# Patient Record
Sex: Female | Born: 1980
Health system: Southern US, Community
[De-identification: ages and names within clinical notes are randomized; demographics above are authoritative.]

## PROBLEM LIST (undated history)

## (undated) DIAGNOSIS — F32A Depression, unspecified: Secondary | ICD-10-CM

## (undated) DIAGNOSIS — E785 Hyperlipidemia, unspecified: Secondary | ICD-10-CM

## (undated) DIAGNOSIS — K219 Gastro-esophageal reflux disease without esophagitis: Secondary | ICD-10-CM

## (undated) DIAGNOSIS — F419 Anxiety disorder, unspecified: Secondary | ICD-10-CM

## (undated) DIAGNOSIS — F329 Major depressive disorder, single episode, unspecified: Secondary | ICD-10-CM

## (undated) DIAGNOSIS — I10 Essential (primary) hypertension: Secondary | ICD-10-CM

## (undated) DIAGNOSIS — E079 Disorder of thyroid, unspecified: Secondary | ICD-10-CM

## (undated) HISTORY — DX: Hyperlipidemia, unspecified: E78.5

## (undated) HISTORY — PX: WISDOM TOOTH EXTRACTION: SHX21

## (undated) HISTORY — PX: TUBAL LIGATION: SHX77

## (undated) HISTORY — DX: Essential (primary) hypertension: I10

## (undated) HISTORY — PX: ABDOMINAL HYSTERECTOMY: SHX81

## (undated) HISTORY — PX: CHOLECYSTECTOMY: SHX55

## (undated) HISTORY — DX: Gastro-esophageal reflux disease without esophagitis: K21.9

## (undated) HISTORY — DX: Disorder of thyroid, unspecified: E07.9

## (undated) HISTORY — PX: INCONTINENCE SURGERY: SHX676

## (undated) HISTORY — PX: TOTAL ABDOMINAL HYSTERECTOMY: SHX209

## (undated) HISTORY — PX: OTHER SURGICAL HISTORY: SHX169

---

## 2013-07-17 DIAGNOSIS — F419 Anxiety disorder, unspecified: Secondary | ICD-10-CM | POA: Insufficient documentation

## 2013-10-22 DIAGNOSIS — E669 Obesity, unspecified: Secondary | ICD-10-CM | POA: Insufficient documentation

## 2013-10-22 DIAGNOSIS — E282 Polycystic ovarian syndrome: Secondary | ICD-10-CM | POA: Insufficient documentation

## 2014-02-26 DIAGNOSIS — Z90710 Acquired absence of both cervix and uterus: Secondary | ICD-10-CM | POA: Insufficient documentation

## 2014-02-26 HISTORY — DX: Acquired absence of both cervix and uterus: Z90.710

## 2014-04-09 DIAGNOSIS — R32 Unspecified urinary incontinence: Secondary | ICD-10-CM | POA: Insufficient documentation

## 2014-10-03 ENCOUNTER — Emergency Department
Admission: EM | Admit: 2014-10-03 | Discharge: 2014-10-03 | Disposition: A | Discharge status: Home / Self Care | Payer: BLUE CROSS/BLUE SHIELD | Source: Home / Self Care | Attending: Emergency Medicine | Admitting: Emergency Medicine

## 2014-10-03 ENCOUNTER — Encounter: Payer: Self-pay | Admitting: Emergency Medicine

## 2014-10-03 DIAGNOSIS — H6593 Unspecified nonsuppurative otitis media, bilateral: Secondary | ICD-10-CM

## 2014-10-03 DIAGNOSIS — J0101 Acute recurrent maxillary sinusitis: Secondary | ICD-10-CM

## 2014-10-03 HISTORY — DX: Depression, unspecified: F32.A

## 2014-10-03 HISTORY — DX: Anxiety disorder, unspecified: F41.9

## 2014-10-03 HISTORY — DX: Major depressive disorder, single episode, unspecified: F32.9

## 2014-10-03 MED ORDER — FLUTICASONE PROPIONATE 50 MCG/ACT NA SUSP: NASAL | Status: DC

## 2014-10-03 MED ORDER — AMOXICILLIN-POT CLAVULANATE 875-125 MG PO TABS: 1.0000 | ORAL_TABLET | Freq: Two times a day (BID) | ORAL | Status: DC

## 2014-10-03 NOTE — ED Notes (Signed)
Reports onset of fever, aches, ear pain with associated sore throat, and cough yesterday. Declines strep test and flu test. Took ibuprofen at 1400 today.

## 2014-10-03 NOTE — ED Provider Notes (Signed)
CSN: [id number]     Arrival date & time 10/03/14  1713 History   First MD Initiated Contact with Patient 10/03/14 1713     Chief Complaint  Patient presents with  . Otalgia  . Sore Throat   (Consider location/radiation/quality/duration/timing/severity/associated sxs/prior Treatment) HPI SINUSITIS  Onset: 3-4 days Facial/sinus pressure with discolored nasal mucus.    Severity: moderate Tried OTC meds without significant relief.  Symptoms:  + Fever  + URI prodrome with nasal congestion + Minimal swollen neck glands + mild Sinus Headache +  ear pressure and pain bilaterally  No Allergy symptoms No significant Sore Throat No eye symptoms     No significant Cough No chest pain No shortness of breath  No wheezing  No Abdominal Pain No Nausea No Vomiting No diarrhea  No Myalgias No focal neurologic symptoms No syncope No Rash  No Urinary symptoms          Past Medical History  Diagnosis Date  . Anxiety and depression    Past Surgical History  Procedure Laterality Date  . Abdominal hysterectomy    . Cholecystectomy     Family History  Problem Relation Age of Onset  . Hypertension Mother   . Hypertension Brother    History  Substance Use Topics  . Smoking status: Never Smoker   . Smokeless tobacco: Not on file  . Alcohol Use: No   OB History    No data available     Review of Systems  All other systems reviewed and are negative.   Allergies  Review of patient's allergies indicates no known allergies.  Home Medications   Prior to Admission medications   Medication Sig Start Date End Date Taking? Authorizing Provider  FLUoxetine (PROZAC) 40 MG capsule Take 40 mg by mouth daily.   Yes Historical Provider, MD  amoxicillin-clavulanate (AUGMENTIN) 875-125 MG per tablet Take 1 tablet by mouth 2 (two) times daily. Take with food. 10/03/14   Lajean Manesavid Massey, MD  fluticasone Aleda Grana(FLONASE) 50 MCG/ACT nasal spray 1 or 2 sprays each nostril twice a day  10/03/14   Lajean Manesavid Massey, MD   BP 157/89 mmHg  Pulse 83  Temp(Src) 98.5 F (36.9 C)  Resp 6  Ht 5\' 4"  (1.626 m)  Wt 175 lb (79.379 kg)  BMI 30.02 kg/m2  SpO2 98% Physical Exam  Constitutional: She is oriented to person, place, and time. She appears well-developed and well-nourished. No distress.  HENT:  Head: Normocephalic and atraumatic.  Right Ear: External ear and ear canal normal. Tympanic membrane is injected. Tympanic membrane is not perforated, not erythematous and not bulging. A middle ear effusion is present. No decreased hearing is noted.  Left Ear: External ear and ear canal normal. Tympanic membrane is injected. Tympanic membrane is not perforated, not erythematous and not bulging. A middle ear effusion is present. No decreased hearing is noted.  Nose: Mucosal edema and rhinorrhea present. Right sinus exhibits maxillary sinus tenderness. Left sinus exhibits maxillary sinus tenderness.  Mouth/Throat: Oropharynx is clear and moist. No oral lesions. No oropharyngeal exudate.  Eyes: Right eye exhibits no discharge. Left eye exhibits no discharge. No scleral icterus.  Neck: Neck supple.  Cardiovascular: Normal rate, regular rhythm and normal heart sounds.   Pulmonary/Chest: Effort normal and breath sounds normal. She has no wheezes. She has no rales.  Lymphadenopathy:    She has no cervical adenopathy.  Neurological: She is alert and oriented to person, place, and time.  Skin: Skin is warm and dry.  Nursing  note and vitals reviewed.   ED Course  Procedures (including critical care time) Labs Review Labs Reviewed - No data to display  Imaging Review No results found.   MDM   1. Acute recurrent maxillary sinusitis   2. Bilateral otitis media with effusion    Treatment options discussed, as well as risks, benefits, alternatives. Patient voiced understanding and agreement with the following plans: Augmentin 875 twice a day 10 days Flonase Other symptomatic care  discussed Follow-up with your primary care doctor in 5-7 days if not improving, or sooner if symptoms become worse. Precautions discussed. Red flags discussed. Questions invited and answered. Patient voiced understanding and agreement.     Lajean Manes, MD 10/03/14 [phone]

## 2016-09-07 DIAGNOSIS — F32A Depression, unspecified: Secondary | ICD-10-CM | POA: Insufficient documentation

## 2017-03-29 DIAGNOSIS — E785 Hyperlipidemia, unspecified: Secondary | ICD-10-CM | POA: Insufficient documentation

## 2017-03-29 DIAGNOSIS — E7800 Pure hypercholesterolemia, unspecified: Secondary | ICD-10-CM | POA: Insufficient documentation

## 2017-04-01 DIAGNOSIS — R7301 Impaired fasting glucose: Secondary | ICD-10-CM | POA: Insufficient documentation

## 2020-05-06 ENCOUNTER — Other Ambulatory Visit: Payer: Self-pay | Admitting: Oncology

## 2020-05-06 DIAGNOSIS — U071 COVID-19: Secondary | ICD-10-CM

## 2020-05-06 NOTE — Progress Notes (Signed)
I connected by phone with Mrs. Krogh to discuss the potential use of an new treatment for mild to moderate COVID-19 viral infection in non-hospitalized patients.   This patient is a age/sex that meets the FDA criteria for Emergency Use Authorization of casirivimab\imdevimab.  Has a (+) direct SARS-CoV-2 viral test result 1. Has mild or moderate COVID-19  2. Is ? 41 years of age and weighs ? 40 kg 3. Is NOT hospitalized due to COVID-19 4. Is NOT requiring oxygen therapy or requiring an increase in baseline oxygen flow rate due to COVID-19 5. Is within 10 days of symptom onset 6. Has at least one of the high risk factor(s) for progression to severe COVID-19 and/or hospitalization as defined in EUA. Specific high risk criteria : Past Medical History:  Diagnosis Date  . Anxiety and depression   ?  HIGH RISK- Obesity   Symptom onset 05/01/2020   I have spoken and communicated the following to the patient or parent/caregiver:   1. FDA has authorized the emergency use of casirivimab\imdevimab for the treatment of mild to moderate COVID-19 in adults and pediatric patients with positive results of direct SARS-CoV-2 viral testing who are 73 years of age and older weighing at least 40 kg, and who are at high risk for progressing to severe COVID-19 and/or hospitalization.   2. The significant known and potential risks and benefits of casirivimab\imdevimab, and the extent to which such potential risks and benefits are unknown.   3. Information on available alternative treatments and the risks and benefits of those alternatives, including clinical trials.   4. Patients treated with casirivimab\imdevimab should continue to self-isolate and use infection control measures (e.g., wear mask, isolate, social distance, avoid sharing personal items, clean and disinfect "high touch" surfaces, and frequent handwashing) according to CDC guidelines.    5. The patient or parent/caregiver has the option to accept  or refuse casirivimab\imdevimab .   After reviewing this information with the patient, The patient agreed to proceed with receiving casirivimab\imdevimab infusion and will be provided a copy of the Fact sheet prior to receiving the infusion.Mignon Pine, AGNP-C [phone] (Infusion Center Hotline)

## 2020-05-07 ENCOUNTER — Ambulatory Visit (HOSPITAL_COMMUNITY)
Admission: RE | Admit: 2020-05-07 | Discharge: 2020-05-07 | Disposition: A | Discharge status: Home / Self Care | Payer: No Typology Code available for payment source | Source: Ambulatory Visit | Attending: Pulmonary Disease | Admitting: Pulmonary Disease

## 2020-05-07 DIAGNOSIS — U071 COVID-19: Secondary | ICD-10-CM | POA: Diagnosis not present

## 2020-05-07 MED ORDER — DIPHENHYDRAMINE HCL 50 MG/ML IJ SOLN: 50.0000 mg | Freq: Once | INTRAMUSCULAR | Status: DC | PRN

## 2020-05-07 MED ORDER — FAMOTIDINE IN NACL 20-0.9 MG/50ML-% IV SOLN: 20.0000 mg | Freq: Once | INTRAVENOUS | Status: DC | PRN

## 2020-05-07 MED ORDER — SODIUM CHLORIDE 0.9 % IV SOLN: INTRAVENOUS | Status: DC | PRN

## 2020-05-07 MED ORDER — ALBUTEROL SULFATE HFA 108 (90 BASE) MCG/ACT IN AERS: 2.0000 | INHALATION_SPRAY | Freq: Once | RESPIRATORY_TRACT | Status: DC | PRN

## 2020-05-07 MED ORDER — METHYLPREDNISOLONE SODIUM SUCC 125 MG IJ SOLR: 125.0000 mg | Freq: Once | INTRAMUSCULAR | Status: DC | PRN

## 2020-05-07 MED ORDER — SODIUM CHLORIDE 0.9 % IV SOLN
1200.0000 mg | Freq: Once | INTRAVENOUS | Status: AC
  Administered 2020-05-07: 1200 mg via INTRAVENOUS

## 2020-05-07 MED ORDER — ONDANSETRON HCL 4 MG/2ML IJ SOLN
4.0000 mg | Freq: Once | INTRAMUSCULAR | Status: AC
  Administered 2020-05-07: 4 mg via INTRAVENOUS
  Filled 2020-05-07: qty 2

## 2020-05-07 MED ORDER — EPINEPHRINE 0.3 MG/0.3ML IJ SOAJ: 0.3000 mg | Freq: Once | INTRAMUSCULAR | Status: DC | PRN

## 2020-05-07 NOTE — Discharge Instructions (Signed)

## 2020-05-07 NOTE — Progress Notes (Signed)
  Diagnosis: COVID-19  Physician: Dr. Wright  Procedure: [facility] Infusion Clinic Med: casirivimab\imdevimab infusion - Provided patient with casirivimab\imdevimab fact sheet for patients, parents and caregivers prior to infusion.  Complications: No immediate complications noted.  Discharge: Discharged home   Anita Heath 05/07/2020   

## 2020-05-09 ENCOUNTER — Ambulatory Visit: Payer: BLUE CROSS/BLUE SHIELD | Admitting: Medical-Surgical

## 2020-05-11 ENCOUNTER — Ambulatory Visit: Payer: BLUE CROSS/BLUE SHIELD | Admitting: Medical-Surgical

## 2020-05-12 ENCOUNTER — Encounter: Payer: Self-pay | Admitting: Medical-Surgical

## 2020-05-12 ENCOUNTER — Ambulatory Visit (INDEPENDENT_AMBULATORY_CARE_PROVIDER_SITE_OTHER): Payer: No Typology Code available for payment source | Admitting: Medical-Surgical

## 2020-05-12 ENCOUNTER — Other Ambulatory Visit: Payer: Self-pay

## 2020-05-12 VITALS — BP 128/83 | HR 82 | Temp 98.5°F | Ht 64.0 in | Wt 222.8 lb

## 2020-05-12 DIAGNOSIS — Z7689 Persons encountering health services in other specified circumstances: Secondary | ICD-10-CM

## 2020-05-12 DIAGNOSIS — Z114 Encounter for screening for human immunodeficiency virus [HIV]: Secondary | ICD-10-CM

## 2020-05-12 DIAGNOSIS — E041 Nontoxic single thyroid nodule: Secondary | ICD-10-CM | POA: Diagnosis not present

## 2020-05-12 DIAGNOSIS — E559 Vitamin D deficiency, unspecified: Secondary | ICD-10-CM

## 2020-05-12 DIAGNOSIS — E785 Hyperlipidemia, unspecified: Secondary | ICD-10-CM

## 2020-05-12 DIAGNOSIS — I10 Essential (primary) hypertension: Secondary | ICD-10-CM

## 2020-05-12 DIAGNOSIS — Z6838 Body mass index (BMI) 38.0-38.9, adult: Secondary | ICD-10-CM

## 2020-05-12 DIAGNOSIS — R0683 Snoring: Secondary | ICD-10-CM

## 2020-05-12 DIAGNOSIS — E661 Drug-induced obesity: Secondary | ICD-10-CM

## 2020-05-12 DIAGNOSIS — Z1159 Encounter for screening for other viral diseases: Secondary | ICD-10-CM

## 2020-05-12 MED ORDER — FLUTICASONE PROPIONATE 50 MCG/ACT NA SUSP: NASAL | 0 refills | Status: DC

## 2020-05-12 MED ORDER — PHENTERMINE HCL 37.5 MG PO TABS: ORAL_TABLET | ORAL | 0 refills | Status: DC

## 2020-05-12 MED FILL — PHENTERMINE 37.5 MG TABLET: 37.5 | 60 days supply | Qty: 30 | Fill #0

## 2020-05-12 NOTE — Progress Notes (Signed)
New Patient Office Visit  Subjective:  Patient ID: Anita Heath, female    DOB: 06-11-1981  Age: 39 y.o. MRN: [id number]  CC:  Chief Complaint  Patient presents with  . Establish Care  . Weight Gain  . Hypothyroidism    HPI Anita Heath presents to establish care.  HTN- Taking Lisinopril 20mg  daily. Tolerating well, no side effects.  Check blood pressures at home with normal readings of 110-115/60-75.  Denies CP, SOB, palpitations, lower extremity edema, dizziness, headaches, or vision changes.  Depression/Anxiety-taking prozac 40mg  daily for years.  Feels that her symptoms are overall well controlled.  Does admit that she has been feeling a little more down about herself lately but feels this is related to weight gain and poor body image.  Is interested in weight loss options today.  Thyroid-history of hyperthyroidism with nodule present.  Taking methimazole 5mg  daily for one year.  Last TSH checked in December 2020.  Overdue due for thyroid , done yearly to evaluate nodule growth.  Was previously managed by endocrinology with [facility] who like to have this managed here if possible.  Sleeping problems-endorses significant difficulty with sleep onset.  Sometimes it takes hours for her to be able to fall asleep.  When she is asleep she is able to stay there without frequent nighttime wakings.  Does endorse loud snoring and daytime fatigue.  Notes her husband has seen her stop breathing in her sleep before.  Has never had a sleep study but is open to having one completed if it can help her. STOP-BANG for SLEEP APNEA Do you Snore loudly? Yes Do you often feel Tired during day? Yes Has anyone Observed you stop breathing? Yes History of high blood Pressure? Yes BMI >35? Yes Age >50? No Neck circumference >16 in? Yes Gender female? No 5-8 = high risk 3-4 = intermediate 0-2 = low risk   Hyperlipidemia-taking rosuvastatin 5 mg daily, tolerating well without side effects.  Last  cholesterol levels checked in December.  Weight concerns-notes that she is heavier now than she was at 9 months pregnant and is very concerned about her weight.  Notes this has a negative effect on not only her body image but her blood pressure as well.  Reports that she did take phentermine approximately 6 years ago with good results and is interested in restarting on that or trying a different medication.  Has made quite a few lifestyle changes in the past few months including cutting out sodas, potatoes, white bread/pasta.  She is also been exercising 4-5 days/week doing cardio as well as weight training.  Trying to eat more fresh foods focusing on lean meats, veggies, and fruits.  With her lifestyle changes, she has been able to lose 3 pounds but feels that she is going to need help to lose more.  Past Medical History:  Diagnosis Date  . Anxiety and depression   . Hyperlipidemia   . Hypertension   . Thyroid disease     Past Surgical History:  Procedure Laterality Date  . ABDOMINAL HYSTERECTOMY    . CHOLECYSTECTOMY    . INCONTINENCE SURGERY    . TUBAL LIGATION    . Uterine Ablation    . WISDOM TOOTH EXTRACTION      Family History  Problem Relation Age of Onset  . Hypertension Mother   . Diabetes Mother   . Thyroid disease Mother   . Hypertension Brother   . Thyroid disease Brother   . Hypertension Maternal Aunt   .  Diabetes Maternal Aunt   . Skin cancer Maternal Aunt   . Thyroid disease Maternal Aunt   . Hypertension Maternal Grandmother   . Thyroid disease Maternal Grandmother   . Hypertension Maternal Grandfather   . Skin cancer Maternal Grandfather   . Thyroid disease Maternal Grandfather     Social History   Socioeconomic History  . Marital status: Married    Spouse name: Not on file  . Number of children: Not on file  . Years of education: Not on file  . Highest education level: Not on file  Occupational History  . Not on file  Tobacco Use  . Smoking status:  Never Smoker  . Smokeless tobacco: Never Used  Vaping Use  . Vaping Use: Never used  Substance and Sexual Activity  . Alcohol use: No  . Drug use: Never  . Sexual activity: Yes    Birth control/protection: Surgical  Other Topics Concern  . Not on file  Social History Narrative  . Not on file   Social Determinants of Health   Financial Resource Strain:   . Difficulty of Paying Living Expenses: Not on file  Food Insecurity:   . Worried About Programme researcher, broadcasting/film/video in the Last Year: Not on file  . Ran Out of Food in the Last Year: Not on file  Transportation Needs:   . Lack of Transportation (Medical): Not on file  . Lack of Transportation (Non-Medical): Not on file  Physical Activity:   . Days of Exercise per Week: Not on file  . Minutes of Exercise per Session: Not on file  Stress:   . Feeling of Stress : Not on file  Social Connections:   . Frequency of Communication with Friends and Family: Not on file  . Frequency of Social Gatherings with Friends and Family: Not on file  . Attends Religious Services: Not on file  . Active Member of Clubs or Organizations: Not on file  . Attends Banker Meetings: Not on file  . Marital Status: Not on file  Intimate Partner Violence:   . Fear of Current or Ex-Partner: Not on file  . Emotionally Abused: Not on file  . Physically Abused: Not on file  . Sexually Abused: Not on file    ROS Review of Systems  Constitutional: Positive for fatigue. Negative for chills, fever and unexpected weight change.  Respiratory: Negative for cough, chest tightness, shortness of breath and wheezing.   Cardiovascular: Negative for chest pain, palpitations and leg swelling.  Gastrointestinal: Negative for abdominal pain, constipation, diarrhea, nausea and vomiting.  Genitourinary: Negative for dysuria, frequency and urgency.  Neurological: Negative for dizziness, light-headedness and headaches.  Psychiatric/Behavioral: Positive for dysphoric  mood and sleep disturbance. Negative for self-injury and suicidal ideas. The patient is not nervous/anxious.     Objective:   Today's Vitals: BP 128/83   Pulse 82   Temp 98.5 F (36.9 C) (Oral)   Ht 5\' 4"  (1.626 m)   Wt 222 lb 12.8 oz (101.1 kg)   SpO2 97%   BMI 38.24 kg/m   Physical Exam Vitals reviewed.  Constitutional:      General: She is not in acute distress.    Appearance: Normal appearance.  HENT:     Head: Normocephalic and atraumatic.  Cardiovascular:     Rate and Rhythm: Normal rate and regular rhythm.     Pulses: Normal pulses.     Heart sounds: Normal heart sounds. No murmur heard.  No friction rub. No  gallop.   Pulmonary:     Effort: Pulmonary effort is normal. No respiratory distress.     Breath sounds: Normal breath sounds. No wheezing.  Skin:    General: Skin is warm and dry.  Neurological:     Mental Status: She is alert and oriented to person, place, and time.  Psychiatric:        Mood and Affect: Mood normal.        Behavior: Behavior normal.        Thought Content: Thought content normal.        Judgment: Judgment normal.     Assessment & Plan:   1. Encounter to establish care Reviewed available records and discussed care concerns with patient.  2. Encounter for screening for HIV Discussed screening recommendations.  Patient is agreeable so we will add this to blood work today. - HIV Antibody (routine testing w rflx)  3. Encounter for hepatitis C screening test for low risk patient Discussed screening recommendations.  She is agreeable to this as well so we are adding this to blood work today. - Hepatitis C antibody  4. Thyroid nodule She is overdue for her thyroid ultrasound, I will go ahead and order that to be completed.  No check in thyroid since December while taking daily methimazole.  Her trend did show an upward tic of her TSH (0.604, 1.745, 3.372). Some of her weight gain and fatigue could be related to thyroid function as this has  not been checked recently. Checking thyroid panel with TSH to evaluate for need to adjust methimazole.  - US THYROID; Future - Thyroid Panel With TSH  5. Hyperlipidemia, unspecified hyperlipidemia type Checking lipid panel today.  Continue Crestor as prescribed. - Lipid panel  6. Hypertension, unspecified type Checking CBC, CMP, and lipid panel today. Continue lisinopril 20 mg daily. - CBC - COMPLETE METABOLIC PANEL WITH GFR  7. Loud snoring Discussed sleep apnea and its effect on the body.  She is agreeable so we are ordering a sleep study today.   - PSG Sleep Study; Future  8. 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  Restarting phentermine one half tab daily for now.  Advised patient to maintain a food diary, continue exercising regularly, and try to increase sleep quality/stress.  9. Vitamin D deficiency History of vitamin D deficiency on oral supplementation daily.  Rechecking vitamin D today as last level was still slightly low despite replacement. - VITAMIN D 25 Hydroxy (Vit-D Deficiency, Fractures)   Outpatient Encounter Medications as of 05/12/2020  Medication Sig  . Ascorbic Acid (VITAMIN C) 1000 MG tablet Take 1,000 mg by mouth daily.  . Cholecalciferol 50 MCG (2000 UT) CAPS Take 1 capsule by mouth daily.  Marland Kitchen FLUoxetine (PROZAC) 40 MG capsule Take 40 mg by mouth daily.  . fluticasone (FLONASE) 50 MCG/ACT nasal spray 1 or 2 sprays each nostril twice a day  . lisinopril (ZESTRIL) 20 MG tablet Take 20 mg by mouth daily.  . Magnesium 250 MG TABS Take 1 tablet by mouth daily.  . methimazole (TAPAZOLE) 5 MG tablet Take 5 mg by mouth daily.  . rosuvastatin (CRESTOR) 5 MG tablet Take 5 mg by mouth daily.  . [DISCONTINUED] amoxicillin-clavulanate (AUGMENTIN) 875-125 MG per tablet Take 1 tablet by mouth 2 (two) times daily. Take  with food.   No facility-administered encounter medications on file  as of 05/12/2020.    Follow-up: No follow-ups on file.   Thayer OhmJoy L. Harlean Regula, DNP, APRN, FNP-BC [facility]

## 2020-05-13 ENCOUNTER — Other Ambulatory Visit: Payer: Self-pay

## 2020-05-13 ENCOUNTER — Encounter: Payer: Self-pay | Admitting: Medical-Surgical

## 2020-05-13 ENCOUNTER — Ambulatory Visit (INDEPENDENT_AMBULATORY_CARE_PROVIDER_SITE_OTHER): Payer: No Typology Code available for payment source

## 2020-05-13 DIAGNOSIS — E041 Nontoxic single thyroid nodule: Secondary | ICD-10-CM

## 2020-05-13 MED ORDER — METHIMAZOLE 5 MG PO TABS: 5.0000 mg | ORAL_TABLET | Freq: Every day | ORAL | 1 refills | Status: DC

## 2020-05-13 MED FILL — methIMAzole 5 MG TABS: 5 | 90 days supply | Qty: 90 | Fill #0

## 2020-05-17 LAB — LIPID PANEL
Cholesterol: 137 mg/dL (ref <200)
HDL: 31 mg/dL — ABNORMAL LOW (ref >=50)
LDL Cholesterol (Calc): 72 mg/dL (calc)
Non-HDL Cholesterol (Calc): 106 mg/dL (calc) (ref <130)
Total CHOL/HDL Ratio: 4.4 (calc) (ref <5.0)
Triglycerides: 245 mg/dL — ABNORMAL HIGH (ref <150)

## 2020-05-17 LAB — CBC
HCT: 42.4 % (ref 35.0–45.0)
Hemoglobin: 14.1 g/dL (ref 11.7–15.5)
MCH: 28.3 pg (ref 27.0–33.0)
MCHC: 33.3 g/dL (ref 32.0–36.0)
MCV: 85.1 fL (ref 80.0–100.0)
MPV: 9.7 fL (ref 7.5–12.5)
Platelets: 321 10*3/uL (ref 140–400)
RBC: 4.98 10*6/uL (ref 3.80–5.10)
RDW: 12.5 % (ref 11.0–15.0)
WBC: 7.5 10*3/uL (ref 3.8–10.8)

## 2020-05-17 LAB — HEPATITIS C ANTIBODY
Hepatitis C Ab: REACTIVE — AB
SIGNAL TO CUT-OFF: 2.22 — ABNORMAL HIGH (ref <1.00)

## 2020-05-17 LAB — COMPLETE METABOLIC PANEL WITH GFR
AG Ratio: 1.5 (calc) (ref 1.0–2.5)
ALT: 29 U/L (ref 6–29)
AST: 24 U/L (ref 10–30)
Albumin: 4.4 g/dL (ref 3.6–5.1)
Alkaline phosphatase (APISO): 79 U/L (ref 31–125)
BUN: 14 mg/dL (ref 7–25)
CO2: 30 mmol/L (ref 20–32)
Calcium: 9.3 mg/dL (ref 8.6–10.2)
Chloride: 101 mmol/L (ref 98–110)
Creat: 0.71 mg/dL (ref 0.50–1.10)
GFR, Est African American: 123 mL/min/{1.73_m2} (ref >=60)
GFR, Est Non African American: 106 mL/min/{1.73_m2} (ref >=60)
Globulin: 2.9 g/dL (calc) (ref 1.9–3.7)
Glucose, Bld: 96 mg/dL (ref 65–99)
Potassium: 4.8 mmol/L (ref 3.5–5.3)
Sodium: 138 mmol/L (ref 135–146)
Total Bilirubin: 0.4 mg/dL (ref 0.2–1.2)
Total Protein: 7.3 g/dL (ref 6.1–8.1)

## 2020-05-17 LAB — HIV ANTIBODY (ROUTINE TESTING W REFLEX): HIV 1&2 Ab, 4th Generation: NONREACTIVE

## 2020-05-17 LAB — THYROID PANEL WITH TSH
Free Thyroxine Index: 2.3 (ref 1.4–3.8)
T3 Uptake: 29 % (ref 22–35)
T4, Total: 8 ug/dL (ref 5.1–11.9)
TSH: 2.82 mIU/L

## 2020-05-17 LAB — VITAMIN D 25 HYDROXY (VIT D DEFICIENCY, FRACTURES): Vit D, 25-Hydroxy: 30 ng/mL (ref 30–100)

## 2020-05-17 LAB — HCV RNA,QUANTITATIVE REAL TIME PCR
HCV Quantitative Log: <1.18 Log IU/mL
HCV RNA, PCR, QN: <15 IU/mL

## 2020-05-19 ENCOUNTER — Other Ambulatory Visit: Payer: Self-pay

## 2020-05-19 ENCOUNTER — Encounter: Payer: Self-pay | Admitting: Medical-Surgical

## 2020-05-19 ENCOUNTER — Ambulatory Visit (INDEPENDENT_AMBULATORY_CARE_PROVIDER_SITE_OTHER): Payer: No Typology Code available for payment source

## 2020-05-19 ENCOUNTER — Telehealth (INDEPENDENT_AMBULATORY_CARE_PROVIDER_SITE_OTHER): Payer: No Typology Code available for payment source | Admitting: Medical-Surgical

## 2020-05-19 DIAGNOSIS — R509 Fever, unspecified: Secondary | ICD-10-CM

## 2020-05-19 DIAGNOSIS — R05 Cough: Secondary | ICD-10-CM

## 2020-05-19 DIAGNOSIS — R059 Cough, unspecified: Secondary | ICD-10-CM

## 2020-05-19 LAB — POCT INFLUENZA A/B
Influenza A, POC: NEGATIVE
Influenza B, POC: NEGATIVE

## 2020-05-19 NOTE — Progress Notes (Signed)
Virtual Visit via Video Note  I connected with Anita Heath on 05/19/20 at  2:40 PM EDT by a video enabled telemedicine application and verified that I am speaking with the correct person using two identifiers.   I discussed the limitations of evaluation and management by telemedicine and the availability of in person appointments. The patient expressed understanding and agreed to proceed.  Patient location: home Provider locations: office  Subjective:    CC: Fever/cough  HPI: Pleasant 39 year old female presenting via MyChart video visit with reports of fever and cough.  She was diagnosed as Covid positive on 9/15 and received the antibiotic infusion after.  Notes that after her infusion she did start to feel better and most of her symptoms fully resolved except for a mild intermittent dry cough and a mild headache.  This morning she awoke to go to work and discovered she had a fever of 101.6 and her signs and symptoms had returned including dyspnea on exertion, body aches, chills, fatigue, and sore throat.  Notes that her cough has also worsened but is still nonproductive.  Has not regained her sense of taste and smell.  She did call health at work who advised her to call her PCP.  They told her since she tested positive for Covid already that she will continue to test positive positive for the next 3 months.  She has been taking Tylenol which does help with her fevers but they return as soon as it wears off.  Denies chest pain and GI symptoms.  States she feels as if she cannot take a full deep breath.  Past medical history, Surgical history, Family history not pertinant except as noted below, Social history, Allergies, and medications have been entered into the medical record, reviewed, and corrections made.   Review of Systems: See HPI for pertinent positives and negatives.   Objective:    General: Speaking clearly in complete sentences without any shortness of breath.  Alert and oriented  x3.  Normal judgment. No apparent acute distress.  Impression and Recommendations:    1. Fever/cough CBC checked last week was normal.  Getting a stat chest x-ray to evaluate for potential pneumonia.  She will do a drive-by flu swab.  Continue Tylenol as needed.  Okay to take over-the-counter cold medications for symptomatic care.  Will need to be out of work for the next 7 days.  Letter provided via MyChart. - DG Chest 2 View; Future - POCT Influenza A/B  Addendum: Rapid flu swab negative.   I discussed the assessment and treatment plan with the patient. The patient was provided an opportunity to ask questions and all were answered. The patient agreed with the plan and demonstrated an understanding of the instructions.   The patient was advised to call back or seek an in-person evaluation if the symptoms worsen or if the condition fails to improve as anticipated.  20 minutes of non face-to-face time was provided during this encounter.  Return if symptoms worsen or fail to improve.  Thayer Ohm, DNP, APRN, FNP-BC [facility] and [facility]

## 2020-05-20 ENCOUNTER — Encounter: Payer: Self-pay | Admitting: Medical-Surgical

## 2020-05-23 ENCOUNTER — Encounter: Payer: Self-pay | Admitting: Medical-Surgical

## 2020-05-24 ENCOUNTER — Encounter: Payer: Self-pay | Admitting: Medical-Surgical

## 2020-05-27 ENCOUNTER — Telehealth: Payer: Self-pay

## 2020-05-27 NOTE — Telephone Encounter (Signed)
Pt aware that the FMLA forms have been completed regarding the portions Anita Heath needs to fill out, but there are several places on the forms that the patient has to fill out. Informed her that I would put them at the front desk for her to come by a pick up. A copy of what Ander Slade has completed has been made to be sent to scanning. No further questions or concerns at this time.

## 2020-05-30 ENCOUNTER — Other Ambulatory Visit: Payer: Self-pay

## 2020-05-30 ENCOUNTER — Other Ambulatory Visit: Payer: Self-pay | Admitting: Medical-Surgical

## 2020-05-30 DIAGNOSIS — I10 Essential (primary) hypertension: Secondary | ICD-10-CM

## 2020-05-30 MED ORDER — LISINOPRIL 20 MG PO TABS: 20.0000 mg | ORAL_TABLET | Freq: Every day | ORAL | 0 refills | Status: DC

## 2020-05-30 MED FILL — LISINOPRIL 20 MG TABLET: 20 | 90 days supply | Qty: 90 | Fill #0

## 2020-06-02 ENCOUNTER — Telehealth (INDEPENDENT_AMBULATORY_CARE_PROVIDER_SITE_OTHER): Payer: No Typology Code available for payment source | Admitting: Medical-Surgical

## 2020-06-02 ENCOUNTER — Encounter: Payer: Self-pay | Admitting: Medical-Surgical

## 2020-06-02 VITALS — BP 112/65 | HR 63 | Temp 98.9°F

## 2020-06-02 DIAGNOSIS — U071 COVID-19: Secondary | ICD-10-CM

## 2020-06-02 NOTE — Progress Notes (Signed)
Virtual Visit via Video Note  I connected with Anita Heath on 06/02/20 at  9:50 AM EDT by a video enabled telemedicine application and verified that I am speaking with the correct person using two identifiers.   I discussed the limitations of evaluation and management by telemedicine and the availability of in person appointments. The patient expressed understanding and agreed to proceed.  Patient location: home Provider locations: office  Subjective:    CC: Covid follow-up   HPI: Pleasant 39 year old female presenting today for follow-up on COVID-19 infection.  She was originally diagnosed on 9/15 and began to feel better shortly after.  She was sent to returned to work on 9/28 but had begun to have fevers again.  With a return of her fever she also experienced some shortness of breath, brain fog, and significant fatigue.  Today she notes she is starting to feel better but she is not 100% yet.  She has had no fever for 2 days.  She has had no shortness of breath and is gradually increasing her exercise.  Reports walking for approximately 30 minutes outside around her neighborhood 1-2 times daily.  At the end of her walk she does still feel somewhat fatigued.  Her stamina is still much decreased from her normal.  Notes that her muscles feel very weak in regards to lifting and she plans to try and work on increasing her muscle strength using free weights.  Denies chills, chest pain, GI symptoms.  She is currently out on FMLA/short-term disability until the end of the month but we will be reevaluating in 1 week to see how she is doing and if she is feeling able to return sooner.   Past medical history, Surgical history, Family history not pertinant except as noted below, Social history, Allergies, and medications have been entered into the medical record, reviewed, and corrections made.   Review of Systems: See HPI for pertinent positives and negatives.   Objective:    General: Speaking clearly  in complete sentences without any shortness of breath.  Alert and oriented x3.  Normal judgment. No apparent acute distress.  Impression and Recommendations:    1. COVID-19 Appears to be doing much better than she was with resolution of fevers and upper respiratory symptoms.  She does still have some decreased stamina and fatigue so she is not ready to go back to work just yet.  Has an appointment to follow-up with me next week and we will reevaluate at that time.  Return for weight check as scheduled.  15 minutes of non-face-to-face time was provided during this encounter.  I discussed the assessment and treatment plan with the patient. The patient was provided an opportunity to ask questions and all were answered. The patient agreed with the plan and demonstrated an understanding of the instructions.   The patient was advised to call back or seek an in-person evaluation if the symptoms worsen or if the condition fails to improve as anticipated.  Thayer Ohm, DNP, APRN, FNP-BC [facility] and [facility] Medicine

## 2020-06-08 DIAGNOSIS — U071 COVID-19: Secondary | ICD-10-CM | POA: Insufficient documentation

## 2020-06-08 DIAGNOSIS — R0683 Snoring: Secondary | ICD-10-CM | POA: Insufficient documentation

## 2020-06-08 DIAGNOSIS — E661 Drug-induced obesity: Secondary | ICD-10-CM | POA: Insufficient documentation

## 2020-06-08 DIAGNOSIS — I10 Essential (primary) hypertension: Secondary | ICD-10-CM | POA: Insufficient documentation

## 2020-06-08 DIAGNOSIS — E66812 Obesity, class 2: Secondary | ICD-10-CM | POA: Insufficient documentation

## 2020-06-08 DIAGNOSIS — E559 Vitamin D deficiency, unspecified: Secondary | ICD-10-CM | POA: Insufficient documentation

## 2020-06-08 DIAGNOSIS — E041 Nontoxic single thyroid nodule: Secondary | ICD-10-CM | POA: Insufficient documentation

## 2020-06-08 DIAGNOSIS — E785 Hyperlipidemia, unspecified: Secondary | ICD-10-CM | POA: Insufficient documentation

## 2020-06-08 NOTE — Progress Notes (Signed)
Subjective:    CC: weight check, COVID follow up  HPI: Pleasant 39 year old female presenting for weight check on Phentermine and COVID 19 follow up.  Weight check- taking 1/2 tab of phentermine 37.5mg , tolerating well without side effects. Weighing at home and her scale showed about a 10lb weight loss in 4 weeks. Our scale shows about 5.5lb weight loss. This has her a bit disheartened. She has been eating about 1200 calories per day, working on getting enough protein. Drinking plenty of water. Exercising 5-6 days per week in the gym with weights and cardio. Tries to walk her dog daily as well. Feels that her clothes are fitting better and her upper abdomen is not as bloated. Easier time bending over to tie her shoes. Has more energy and feels better overall. Denies CP, SOB, palpitations, and GI symptoms. Reports she was told in her earlier years that she had PCOS and wonders if another medication like Reginal Lutes may be better for her.  COVID- Has completely recovered from her illness and is going back to work. Reports she does not need a letter to go back before the end of the month.   I reviewed the past medical history, family history, social history, surgical history, and allergies today and no changes were needed.  Please see the problem list section below in epic for further details.  Past Medical History: Past Medical History:  Diagnosis Date  . Anxiety and depression   . Hyperlipidemia   . Hypertension   . Thyroid disease    Past Surgical History: Past Surgical History:  Procedure Laterality Date  . ABDOMINAL HYSTERECTOMY    . CHOLECYSTECTOMY    . INCONTINENCE SURGERY    . TUBAL LIGATION    . Uterine Ablation    . WISDOM TOOTH EXTRACTION     Social History: Social History   Socioeconomic History  . Marital status: Married    Spouse name: Not on file  . Number of children: Not on file  . Years of education: Not on file  . Highest education level: Not on file  Occupational  History  . Not on file  Tobacco Use  . Smoking status: Never Smoker  . Smokeless tobacco: Never Used  Vaping Use  . Vaping Use: Never used  Substance and Sexual Activity  . Alcohol use: No  . Drug use: Never  . Sexual activity: Yes    Birth control/protection: Surgical  Other Topics Concern  . Not on file  Social History Narrative  . Not on file   Social Determinants of Health   Financial Resource Strain:   . Difficulty of Paying Living Expenses: Not on file  Food Insecurity:   . Worried About Programme researcher, broadcasting/film/video in the Last Year: Not on file  . Ran Out of Food in the Last Year: Not on file  Transportation Needs:   . Lack of Transportation (Medical): Not on file  . Lack of Transportation (Non-Medical): Not on file  Physical Activity:   . Days of Exercise per Week: Not on file  . Minutes of Exercise per Session: Not on file  Stress:   . Feeling of Stress : Not on file  Social Connections:   . Frequency of Communication with Friends and Family: Not on file  . Frequency of Social Gatherings with Friends and Family: Not on file  . Attends Religious Services: Not on file  . Active Member of Clubs or Organizations: Not on file  . Attends Banker Meetings:  Not on file  . Marital Status: Not on file   Family History: Family History  Problem Relation Age of Onset  . Hypertension Mother   . Diabetes Mother   . Thyroid disease Mother   . Hypertension Brother   . Thyroid disease Brother   . Hypertension Maternal Aunt   . Diabetes Maternal Aunt   . Skin cancer Maternal Aunt   . Thyroid disease Maternal Aunt   . Hypertension Maternal Grandmother   . Thyroid disease Maternal Grandmother   . Hypertension Maternal Grandfather   . Skin cancer Maternal Grandfather   . Thyroid disease Maternal Grandfather    Allergies: No Known Allergies Medications: See med rec.  Review of Systems: See HPI for pertinent positives and negatives.   Objective:    General: Well  Developed, well nourished, and in no acute distress.  Neuro: Alert and oriented x3.  HEENT: Normocephalic, atraumatic.  Skin: Warm and dry. Cardiac: Regular rate and rhythm, no murmurs rubs or gallops, no lower extremity edema.  Respiratory: Clear to auscultation bilaterally. Not using accessory muscles, speaking in full sentences.  Impression and Recommendations:    1. 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 Discussed PCOS and the associated insulin resistance. Reginal Lutes would be a good option for her so we are starting that today at the 0.25mg  weekly dose. Discount card provided and instructions on activation and use discussed.   2. COVID-19 Resolved.   Return in about 4 weeks (around 07/07/2020) for weight check. ___________________________________________ Thayer Ohm, DNP, APRN, FNP-BC Primary Care and Sports Medicine [facility] [place]

## 2020-06-09 ENCOUNTER — Encounter: Payer: Self-pay | Admitting: Medical-Surgical

## 2020-06-09 ENCOUNTER — Ambulatory Visit (INDEPENDENT_AMBULATORY_CARE_PROVIDER_SITE_OTHER): Payer: No Typology Code available for payment source | Admitting: Medical-Surgical

## 2020-06-09 ENCOUNTER — Other Ambulatory Visit: Payer: Self-pay

## 2020-06-09 VITALS — BP 134/82 | HR 78 | Temp 98.2°F | Ht 64.0 in | Wt 217.2 lb

## 2020-06-09 DIAGNOSIS — Z6837 Body mass index (BMI) 37.0-37.9, adult: Secondary | ICD-10-CM | POA: Diagnosis not present

## 2020-06-09 DIAGNOSIS — U071 COVID-19: Secondary | ICD-10-CM

## 2020-06-09 DIAGNOSIS — E661 Drug-induced obesity: Secondary | ICD-10-CM | POA: Diagnosis not present

## 2020-06-09 MED ORDER — SEMAGLUTIDE-WEIGHT MANAGEMENT 2.4 MG/0.75ML ~~LOC~~ SOAJ: 2.4000 mg | SUBCUTANEOUS | 1 refills | Status: AC

## 2020-06-09 MED ORDER — SEMAGLUTIDE-WEIGHT MANAGEMENT 0.5 MG/0.5ML ~~LOC~~ SOAJ: 0.5000 mg | SUBCUTANEOUS | 0 refills | Status: AC

## 2020-06-09 MED ORDER — SEMAGLUTIDE-WEIGHT MANAGEMENT 1.7 MG/0.75ML ~~LOC~~ SOAJ: 1.7000 mg | SUBCUTANEOUS | 0 refills | Status: DC

## 2020-06-09 MED ORDER — SEMAGLUTIDE-WEIGHT MANAGEMENT 0.25 MG/0.5ML ~~LOC~~ SOAJ: 0.2500 mg | SUBCUTANEOUS | 0 refills | Status: AC

## 2020-06-09 MED ORDER — SEMAGLUTIDE-WEIGHT MANAGEMENT 1 MG/0.5ML ~~LOC~~ SOAJ: 1.0000 mg | SUBCUTANEOUS | 0 refills | Status: DC

## 2020-06-13 ENCOUNTER — Encounter: Payer: Self-pay | Admitting: Medical-Surgical

## 2020-06-20 MED FILL — methIMAzole 5 MG TABS: 5 | 90 days supply | Qty: 90 | Fill #0

## 2020-06-20 MED FILL — LISINOPRIL 20 MG TABLET: 20 | 90 days supply | Qty: 90 | Fill #0

## 2020-06-28 ENCOUNTER — Encounter: Payer: Self-pay | Admitting: Medical-Surgical

## 2020-06-28 NOTE — Telephone Encounter (Signed)
Looks like this is a weight check.. do we need to do this in person?

## 2020-07-08 ENCOUNTER — Encounter: Payer: Self-pay | Admitting: Medical-Surgical

## 2020-07-08 ENCOUNTER — Ambulatory Visit (INDEPENDENT_AMBULATORY_CARE_PROVIDER_SITE_OTHER): Payer: No Typology Code available for payment source | Admitting: Medical-Surgical

## 2020-07-08 VITALS — BP 107/75 | HR 85 | Temp 98.0°F | Ht 64.0 in | Wt 217.4 lb

## 2020-07-08 DIAGNOSIS — Z6837 Body mass index (BMI) 37.0-37.9, adult: Secondary | ICD-10-CM | POA: Diagnosis not present

## 2020-07-08 DIAGNOSIS — E661 Drug-induced obesity: Secondary | ICD-10-CM | POA: Diagnosis not present

## 2020-07-08 DIAGNOSIS — E66812 Obesity, class 2: Secondary | ICD-10-CM

## 2020-07-08 DIAGNOSIS — F418 Other specified anxiety disorders: Secondary | ICD-10-CM | POA: Diagnosis not present

## 2020-07-08 MED ORDER — FLUOXETINE HCL 10 MG PO CAPS: 10.0000 mg | ORAL_CAPSULE | Freq: Every day | ORAL | 1 refills | Status: DC

## 2020-07-08 NOTE — Progress Notes (Signed)
Subjective:    CC: weight check  HPI: Pleasant 39 year old female presenting today for weight check on Wegovy.  She has completed 4 weeks of Wegovy 0.25 mg weekly.  Tolerating her injections well without site reactions or side effects.  Notes that she has had some constipation but this has been ongoing since she was on phentermine.  She does drink approximately 1 gallon of water a day.  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  This has brought some increased stress due to learning new things and having new responsibilities.  In her medical office, they do have lunches delivered each day.  She has been trying to make smart choices and eat small solids or small portions of the lunch as they provide.  If she is a heavier lunch, she does try to eat a smaller dinner.  She continues to exercise 3-4 times weekly.  She has already been able to obtain her next dose of 0.5 mg of Wegovy and did her first injection today.  Denies fever, chills, chest pain, shortness of breath, nausea/vomiting.  Mood-taking fluoxetine 40 mg daily for several years.  She tolerates this well and has no side effects.  Notes that she has been more agitated and jittery lately and wonders if this may be related to job stress or perhaps a need to adjust medication.  She does report she is sleeping very well now that she is off of phentermine.  Denies SI/HI  I reviewed the past medical history, family history, social history, surgical history, and allergies today and no changes were needed.  Please see the problem list section below in epic for further details.  Past Medical History: Past Medical History:  Diagnosis Date  . Anxiety and depression   . Hyperlipidemia   . Hypertension   . Thyroid disease    Past Surgical History: Past Surgical History:  Procedure Laterality Date  . ABDOMINAL HYSTERECTOMY    . CHOLECYSTECTOMY    . INCONTINENCE SURGERY    . TUBAL LIGATION    . Uterine  Ablation    . WISDOM TOOTH EXTRACTION     Social History: Social History   Socioeconomic History  . Marital status: Married    Spouse name: Not on file  . Number of children: Not on file  . Years of education: Not on file  . Highest education level: Not on file  Occupational History  . Not on file  Tobacco Use  . Smoking status: Never Smoker  . Smokeless tobacco: Never Used  Vaping Use  . Vaping Use: Never used  Substance and Sexual Activity  . Alcohol use: No  . Drug use: Never  . Sexual activity: Yes    Birth control/protection: Surgical  Other Topics Concern  . Not on file  Social History Narrative  . Not on file   Social Determinants of Health   Financial Resource Strain:   . Difficulty of Paying Living Expenses: Not on file  Food Insecurity:   . Worried About Programme researcher, broadcasting/film/video in the Last Year: Not on file  . Ran Out of Food in the Last Year: Not on file  Transportation Needs:   . Lack of Transportation (Medical): Not on file  . Lack of Transportation (Non-Medical): Not on file  Physical Activity:   . Days of Exercise per Week: Not on file  . Minutes of Exercise per Session: Not on file  Stress:   . Feeling of Stress :  Not on file  Social Connections:   . Frequency of Communication with Friends and Family: Not on file  . Frequency of Social Gatherings with Friends and Family: Not on file  . Attends Religious Services: Not on file  . Active Member of Clubs or Organizations: Not on file  . Attends Banker Meetings: Not on file  . Marital Status: Not on file   Family History: Family History  Problem Relation Age of Onset  . Hypertension Mother   . Diabetes Mother   . Thyroid disease Mother   . Hypertension Brother   . Thyroid disease Brother   . Hypertension Maternal Aunt   . Diabetes Maternal Aunt   . Skin cancer Maternal Aunt   . Thyroid disease Maternal Aunt   . Hypertension Maternal Grandmother   . Thyroid disease Maternal  Grandmother   . Hypertension Maternal Grandfather   . Skin cancer Maternal Grandfather   . Thyroid disease Maternal Grandfather    Allergies: No Known Allergies Medications: See med rec.  Review of Systems: See HPI for pertinent positives and negatives.   Objective:    General: Well Developed, well nourished, and in no acute distress.  Neuro: Alert and oriented x3.  HEENT: Normocephalic, atraumatic.  Skin: Warm and dry. Cardiac: Regular rate and rhythm, no murmurs rubs or gallops, no lower extremity edema.  Respiratory: Clear to auscultation bilaterally. Not using accessory muscles, speaking in full sentences.  Impression and Recommendations:    1. Class 2 drug-induced obesity without serious comorbidity with body mass index (BMI) of 37.0 to 37.9 in adult No weight loss recorded over the last 4 weeks however she is waiting at the end of the day after 2 meals and drinking water all day.  By her scales at home, she has lost approximately 3 pounds this past month.  Continue Wegovy with an increased dose of 0.5 mg weekly for the full 4 weeks.  Continue dietary and lifestyle modifications.  Monitor caloric intake as closely as possible and make sure to avoid high calorie foods.  Continue exercising.  Recommend measuring circumference of bilateral thighs, hips, waist, upper arms, and neck to help gauge progress.  Consider adding in a daily stool softener or fiber supplement for constipation.  2. Anxiety with depression Increasing fluoxetine to 50 mg daily to see if this will help with her agitation and jitteriness.  Return in about 4 weeks (around 08/05/2020) for weight check. ___________________________________________ Thayer Ohm, DNP, APRN, FNP-BC Primary Care and Sports Medicine [facility] [place]

## 2020-07-10 ENCOUNTER — Encounter (HOSPITAL_BASED_OUTPATIENT_CLINIC_OR_DEPARTMENT_OTHER): Payer: No Typology Code available for payment source | Admitting: Internal Medicine

## 2020-07-11 ENCOUNTER — Other Ambulatory Visit: Payer: Self-pay | Admitting: Medical-Surgical

## 2020-07-11 ENCOUNTER — Encounter: Payer: Self-pay | Admitting: Medical-Surgical

## 2020-07-11 MED ORDER — HYDROXYZINE PAMOATE 25 MG PO CAPS: 25.0000 mg | ORAL_CAPSULE | Freq: Three times a day (TID) | ORAL | 0 refills | Status: DC | PRN

## 2020-07-11 MED FILL — HYDROXYZINE PAM 25 MG CAP: 25 | 10 days supply | Qty: 30 | Fill #0

## 2020-07-11 NOTE — Progress Notes (Signed)
Sending in Vistaril 25 mg 3 times daily as needed for anxiety.

## 2020-08-05 ENCOUNTER — Ambulatory Visit: Payer: No Typology Code available for payment source | Admitting: Medical-Surgical

## 2020-08-15 ENCOUNTER — Encounter: Payer: Self-pay | Admitting: Medical-Surgical

## 2020-08-17 ENCOUNTER — Encounter: Payer: Self-pay | Admitting: Medical-Surgical

## 2020-08-17 ENCOUNTER — Other Ambulatory Visit: Payer: Self-pay

## 2020-08-17 ENCOUNTER — Ambulatory Visit (INDEPENDENT_AMBULATORY_CARE_PROVIDER_SITE_OTHER): Payer: Self-pay | Admitting: Medical-Surgical

## 2020-08-17 VITALS — BP 138/84 | HR 70 | Temp 98.9°F | Ht 64.0 in | Wt 215.0 lb

## 2020-08-17 DIAGNOSIS — R300 Dysuria: Secondary | ICD-10-CM

## 2020-08-17 DIAGNOSIS — N12 Tubulo-interstitial nephritis, not specified as acute or chronic: Secondary | ICD-10-CM

## 2020-08-17 LAB — POCT URINALYSIS DIP (CLINITEK)
Bilirubin, UA: NEGATIVE
Blood, UA: NEGATIVE
Glucose, UA: NEGATIVE mg/dL
Ketones, POC UA: NEGATIVE mg/dL
Leukocytes, UA: NEGATIVE
Nitrite, UA: POSITIVE — AB
POC PROTEIN,UA: NEGATIVE
Spec Grav, UA: 1.01 (ref 1.010–1.025)
Urobilinogen, UA: 0.2 E.U./dL
pH, UA: 7 (ref 5.0–8.0)

## 2020-08-17 MED ORDER — TRAMADOL HCL 50 MG PO TABS: 50.0000 mg | ORAL_TABLET | Freq: Three times a day (TID) | ORAL | 0 refills | Status: AC | PRN

## 2020-08-17 MED ORDER — CIPROFLOXACIN HCL 500 MG PO TABS: 500.0000 mg | ORAL_TABLET | Freq: Two times a day (BID) | ORAL | 0 refills | Status: DC

## 2020-08-17 MED ORDER — CEFTRIAXONE SODIUM 1 G IJ SOLR
1.0000 g | Freq: Once | INTRAMUSCULAR | Status: AC
  Administered 2020-08-17: 1 g via INTRAMUSCULAR

## 2020-08-17 MED ORDER — TRAMADOL HCL 50 MG PO TABS: 50.0000 mg | ORAL_TABLET | Freq: Three times a day (TID) | ORAL | 0 refills | Status: DC | PRN

## 2020-08-17 NOTE — Progress Notes (Signed)
Subjective:    CC: dysuria  HPI: Pleasant 39 year old female presenting today with a couple of weeks of urinary symptoms including burning, urgency, suprapubic pressure, urinary odor, and frequency.  She has been trying to drink plenty of water to flush her system but her symptoms have not improved.  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  At that time he informed her that she had a UTI and prescribed Macrobid for her.  She has taken 2 full days of Macrobid but her symptoms are worsening.  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  She has run a low-grade fever, yesterday T-max 100.9 F.  She has also had some intermittent nausea and has vomited at least once.  Now she is having suprapubic pain as well as right flank pain.  Using Tylenol as needed for discomfort but this is not helping much.  Tylenol is helping her fever.  I reviewed the past medical history, family history, social history, surgical history, and allergies today and no changes were needed.  Please see the problem list section below in epic for further details.  Past Medical History: Past Medical History:  Diagnosis Date  . Anxiety and depression   . Hyperlipidemia   . Hypertension   . Thyroid disease    Past Surgical History: Past Surgical History:  Procedure Laterality Date  . ABDOMINAL HYSTERECTOMY    . CHOLECYSTECTOMY    . INCONTINENCE SURGERY    . TUBAL LIGATION    . Uterine Ablation    . WISDOM TOOTH EXTRACTION     Social History: Social History   Socioeconomic History  . Marital status: Married    Spouse name: Not on file  . Number of children: Not on file  . Years of education: Not on file  . Highest education level: Not on file  Occupational History  . Not on file  Tobacco Use  . Smoking status: Never Smoker  . Smokeless tobacco: Never Used   Vaping Use  . Vaping Use: Never used  Substance and Sexual Activity  . Alcohol use: No  . Drug use: Never  . Sexual activity: Yes    Birth control/protection: Surgical  Other Topics Concern  . Not on file  Social History Narrative  . Not on file   Social Determinants of Health   Financial Resource Strain: Not on file  Food Insecurity: Not on file  Transportation Needs: Not on file  Physical Activity: Not on file  Stress: Not on file  Social Connections: Not on file   Family History: Family History  Problem Relation Age of Onset  . Hypertension Mother   . Diabetes Mother   . Thyroid disease Mother   . Hypertension Brother   . Thyroid disease Brother   . Hypertension Maternal Aunt   . Diabetes Maternal Aunt   . Skin cancer Maternal Aunt   . Thyroid disease Maternal Aunt   . Hypertension Maternal Grandmother   . Thyroid disease Maternal Grandmother   . Hypertension Maternal Grandfather   . Skin cancer Maternal Grandfather   . Thyroid disease Maternal Grandfather    Allergies: No Known Allergies Medications: See med rec.  Review of Systems: See HPI for pertinent positives and negatives.   Objective:    General: Well Developed, well nourished, and in no acute distress.  Neuro: Alert and oriented x3.  HEENT: Normocephalic, atraumatic.  Skin: Warm and dry. Cardiac: Regular rate and rhythm, no murmurs rubs or gallops, no lower extremity edema.  Respiratory: Clear to auscultation bilaterally. Not using accessory muscles, speaking in full sentences.  Impression and Recommendations:    1. Dysuria POCT UA positive for nitrates but otherwise negative. Sending for culture. - POCT URINALYSIS DIP (CLINITEK) - Urine Culture  2. Pyelonephritis Despite being on Macrobid and Azo for the last couple of days, her symptoms are worsening. Given the presence of fever, nausea, flank/abdominal pain and urinary symptoms, treating for pyelonephritis with 1 g Rocephin IM in office  followed by Cipro twice daily x7 days. We will reevaluate when her culture becomes available. Low suspicion for kidney stone with no hematuria and a negative history, but urine strainer evaluate for patient use at home. Advised that we can send a stone off to the lab if one should pass. If no relief in symptoms or poor response to antibiotics in 72 hours, will order a renal ultrasound for further evaluation. Small supply of Tramadol sent prn severe pain since tylenol and ibuprofen aren't helping.   Return if symptoms worsen or fail to improve. ___________________________________________ Thayer Ohm, DNP, APRN, FNP-BC Primary Care and Sports Medicine [facility] [place]

## 2020-08-18 LAB — URINE CULTURE
MICRO NUMBER:: 11368294
SPECIMEN QUALITY:: ADEQUATE

## 2020-08-22 ENCOUNTER — Encounter: Payer: Self-pay | Admitting: Medical-Surgical

## 2020-08-22 ENCOUNTER — Ambulatory Visit (INDEPENDENT_AMBULATORY_CARE_PROVIDER_SITE_OTHER): Payer: No Typology Code available for payment source

## 2020-08-22 ENCOUNTER — Other Ambulatory Visit: Payer: Self-pay | Admitting: Medical-Surgical

## 2020-08-22 ENCOUNTER — Other Ambulatory Visit: Payer: Self-pay

## 2020-08-22 DIAGNOSIS — N12 Tubulo-interstitial nephritis, not specified as acute or chronic: Secondary | ICD-10-CM

## 2020-08-22 DIAGNOSIS — R10819 Abdominal tenderness, unspecified site: Secondary | ICD-10-CM

## 2020-08-22 DIAGNOSIS — R109 Unspecified abdominal pain: Secondary | ICD-10-CM | POA: Diagnosis not present

## 2020-08-22 DIAGNOSIS — R110 Nausea: Secondary | ICD-10-CM

## 2020-08-22 DIAGNOSIS — R300 Dysuria: Secondary | ICD-10-CM

## 2020-08-23 ENCOUNTER — Telehealth: Payer: Self-pay

## 2020-08-23 LAB — URINALYSIS, ROUTINE W REFLEX MICROSCOPIC
Bilirubin Urine: NEGATIVE
Glucose, UA: NEGATIVE
Hgb urine dipstick: NEGATIVE
Ketones, ur: NEGATIVE
Leukocytes,Ua: NEGATIVE
Nitrite: NEGATIVE
Protein, ur: NEGATIVE
Specific Gravity, Urine: 1.004 (ref 1.001–1.03)
pH: 6 (ref 5.0–8.0)

## 2020-08-23 LAB — URINE CULTURE
MICRO NUMBER:: 11377741
Result:: NO GROWTH
SPECIMEN QUALITY:: ADEQUATE

## 2020-08-23 NOTE — Telephone Encounter (Signed)
Patient Anita Heath stating kidney infection didn't feel any better. No appetite. Feeling drained.  Unsure if this message was left before or after the patient sent a MyChart to Christen Butter, NP.   She has responded. Forwarding this message to [place] as well just in case.

## 2020-08-28 ENCOUNTER — Encounter: Payer: Self-pay | Admitting: Medical-Surgical

## 2020-08-28 MED ORDER — FLUOXETINE HCL 40 MG PO CAPS: 40.0000 mg | ORAL_CAPSULE | Freq: Every day | ORAL | 1 refills | Status: DC

## 2020-08-28 MED ORDER — ROSUVASTATIN CALCIUM 5 MG PO TABS: 5.0000 mg | ORAL_TABLET | Freq: Every day | ORAL | 1 refills | Status: DC

## 2020-09-07 ENCOUNTER — Telehealth: Payer: Self-pay

## 2020-09-07 NOTE — Telephone Encounter (Signed)
[facility] called wanting to let the ordering provider know that they tried to run the authorization for the sleep study through her insurance again and it was denied.

## 2020-09-07 NOTE — Telephone Encounter (Signed)
Please contact patient.  She may need to contact her insurance company to see the grounds for the denial regarding the sleep study.  They may have a particular provider or criteria that they want prior to being able to approve it.  If she can find out this information, I will be able to reorder the sleep study so that they will cover the test.

## 2020-09-08 NOTE — Telephone Encounter (Signed)
Pt aware of Joy's message and states she will call her insurance company for the info and let us know what they say. No further questions or concerns at this time.

## 2020-09-08 NOTE — Telephone Encounter (Signed)
LVMTRC (1st attempt)   

## 2020-09-22 ENCOUNTER — Telehealth: Payer: Self-pay

## 2020-09-22 NOTE — Telephone Encounter (Signed)
LVMTRC (1st attempt)    Letter received from pts insurance company stating they will not cover a sleep study at a sleep center. Per Ander Slade, pt needs to contact her insurance company to see if they will cover a home sleep study test. If they will, she will place the order.

## 2020-09-23 NOTE — Telephone Encounter (Signed)
Pt aware and will get in touch with her insurance company and call me back and let me know if they will cover the home sleep study. No further questions or concerns at this time.

## 2020-10-23 ENCOUNTER — Other Ambulatory Visit: Payer: Self-pay | Admitting: Medical-Surgical

## 2020-10-23 DIAGNOSIS — I10 Essential (primary) hypertension: Secondary | ICD-10-CM

## 2020-10-24 MED ORDER — LISINOPRIL 20 MG PO TABS: 20.0000 mg | ORAL_TABLET | Freq: Every day | ORAL | 0 refills | Status: DC

## 2020-10-25 ENCOUNTER — Ambulatory Visit: Payer: No Typology Code available for payment source | Admitting: Medical-Surgical

## 2020-10-25 DIAGNOSIS — E661 Drug-induced obesity: Secondary | ICD-10-CM

## 2020-11-10 NOTE — Progress Notes (Signed)
Subjective:    CC: discuss weight loss  HPI: Pleasant 40 year old female presenting to discuss weight loss. She has been taking Wegovy and is on her last month of the discount card. Has one more dose of 2.4mg  due next week then will be out. Would like to discuss the plan for continued weight loss. She has successfully lost approximately 20lbs on the medication. Tolerating well although she does have GI issues the first few days after each dose. Exercising 3 times weekly doing 30-45 minutes on a treadmill, sometimes does 4-5 days per week. Notably decreased appetite and eating smaller portions. Drinking water with only the occasional diet soda. Sleeping well. Denies fever, chills, chest pain, palpitations, constipation, and vomiting.   I reviewed the past medical history, family history, social history, surgical history, and allergies today and no changes were needed.  Please see the problem list section below in epic for further details.  Past Medical History: Past Medical History:  Diagnosis Date  . Anxiety and depression   . Hyperlipidemia   . Hypertension   . Thyroid disease    Past Surgical History: Past Surgical History:  Procedure Laterality Date  . ABDOMINAL HYSTERECTOMY    . CHOLECYSTECTOMY    . INCONTINENCE SURGERY    . TUBAL LIGATION    . Uterine Ablation    . WISDOM TOOTH EXTRACTION     Social History: Social History   Socioeconomic History  . Marital status: Married    Spouse name: Not on file  . Number of children: Not on file  . Years of education: Not on file  . Highest education level: Not on file  Occupational History  . Not on file  Tobacco Use  . Smoking status: Never Smoker  . Smokeless tobacco: Never Used  Vaping Use  . Vaping Use: Never used  Substance and Sexual Activity  . Alcohol use: No  . Drug use: Never  . Sexual activity: Yes    Birth control/protection: Surgical  Other Topics Concern  . Not on file  Social History Narrative  . Not on  file   Social Determinants of Health   Financial Resource Strain: Not on file  Food Insecurity: Not on file  Transportation Needs: Not on file  Physical Activity: Not on file  Stress: Not on file  Social Connections: Not on file   Family History: Family History  Problem Relation Age of Onset  . Hypertension Mother   . Diabetes Mother   . Thyroid disease Mother   . Hypertension Brother   . Thyroid disease Brother   . Hypertension Maternal Aunt   . Diabetes Maternal Aunt   . Skin cancer Maternal Aunt   . Thyroid disease Maternal Aunt   . Hypertension Maternal Grandmother   . Thyroid disease Maternal Grandmother   . Hypertension Maternal Grandfather   . Skin cancer Maternal Grandfather   . Thyroid disease Maternal Grandfather    Allergies: No Known Allergies Medications: See med rec.  Review of Systems: See HPI for pertinent positives and negatives.   Objective:    General: Well Developed, well nourished, and in no acute distress.  Neuro: Alert and oriented x3.  HEENT: Normocephalic, atraumatic.  Skin: Warm and dry. Cardiac: Regular rate and rhythm, no murmurs rubs or gallops, no lower extremity edema.  Respiratory: Clear to auscultation bilaterally. Not using accessory muscles, speaking in full sentences.   Impression and Recommendations:    1. Class 2 drug-induced obesity without serious comorbidity with body mass index (BMI)  of 35.0 to 35.9 in adult She has had great results with Reginal Lutes so far and would like to continue if her insurance will cover this.  Sending in refills of the 2.4 mg dose once weekly to see if we can get this covered.  She does have obesity, hypertension, hyperlipidemia, and prediabetes which qualifies for diagnosis with metabolic syndrome and would benefit from continued treatment with semaglutide.  Discussed further options for weight management including Saxenda, phentermine, Qsymia, and Contrave.  She will consider what she would like to change  to if [facility] will not be covered.  2. Hypertension, unspecified type Doing well on lisinopril 20 mg daily.  Blood pressure at goal today.  3. Hyperlipidemia, unspecified hyperlipidemia type Doing well on Crestor 5 mg daily, tolerating well.   4. Prediabetes History of prediabetes with A1c of 5.7%.  Since she has been [place], recheck would likely show a normal hemoglobin A1c.  5.  Metabolic syndrome She has obesity, hypertension, hyperlipidemia, and prediabetes so adding metabolic syndrome to her diagnosis list.  Return in about 4 weeks (around 12/09/2020) for weight check. ___________________________________________ Thayer Ohm, DNP, APRN, FNP-BC Primary Care and Sports Medicine [facility] [place]

## 2020-11-11 ENCOUNTER — Other Ambulatory Visit: Payer: Self-pay

## 2020-11-11 ENCOUNTER — Encounter: Payer: Self-pay | Admitting: Medical-Surgical

## 2020-11-11 ENCOUNTER — Ambulatory Visit: Payer: No Typology Code available for payment source | Admitting: Medical-Surgical

## 2020-11-11 VITALS — BP 126/78 | HR 82 | Temp 99.0°F | Ht 64.0 in | Wt 207.7 lb

## 2020-11-11 DIAGNOSIS — I10 Essential (primary) hypertension: Secondary | ICD-10-CM

## 2020-11-11 DIAGNOSIS — R7303 Prediabetes: Secondary | ICD-10-CM | POA: Diagnosis not present

## 2020-11-11 DIAGNOSIS — E8881 Metabolic syndrome: Secondary | ICD-10-CM

## 2020-11-11 DIAGNOSIS — E785 Hyperlipidemia, unspecified: Secondary | ICD-10-CM | POA: Diagnosis not present

## 2020-11-11 DIAGNOSIS — E661 Drug-induced obesity: Secondary | ICD-10-CM | POA: Diagnosis not present

## 2020-11-11 DIAGNOSIS — Z6838 Body mass index (BMI) 38.0-38.9, adult: Secondary | ICD-10-CM

## 2020-11-11 MED ORDER — SEMAGLUTIDE-WEIGHT MANAGEMENT 2.4 MG/0.75ML ~~LOC~~ SOAJ: 2.4000 mg | SUBCUTANEOUS | 2 refills | Status: DC

## 2020-11-15 ENCOUNTER — Telehealth: Payer: Self-pay

## 2020-11-15 MED ORDER — PHENTERMINE HCL 15 MG PO CAPS: 15.0000 mg | ORAL_CAPSULE | ORAL | 0 refills | Status: DC

## 2020-11-15 MED ORDER — TOPIRAMATE 25 MG PO TABS
ORAL_TABLET | ORAL | 0 refills | Status: DC
Start: 2020-11-15 — End: 2021-01-27

## 2020-11-15 NOTE — Addendum Note (Signed)
Addended byChristen Butter on: 11/15/2020 06:36 PM   Modules accepted: Orders

## 2020-11-15 NOTE — Telephone Encounter (Signed)
PA for [facility] submitted through CoverMyMeds has been denied.

## 2020-11-15 NOTE — Telephone Encounter (Signed)
Noted. Thanks.

## 2020-12-09 ENCOUNTER — Ambulatory Visit: Payer: No Typology Code available for payment source | Admitting: Medical-Surgical

## 2020-12-13 ENCOUNTER — Ambulatory Visit: Payer: No Typology Code available for payment source | Admitting: Medical-Surgical

## 2020-12-15 ENCOUNTER — Ambulatory Visit: Payer: No Typology Code available for payment source | Admitting: Medical-Surgical

## 2020-12-15 DIAGNOSIS — E661 Drug-induced obesity: Secondary | ICD-10-CM

## 2020-12-20 ENCOUNTER — Ambulatory Visit: Payer: No Typology Code available for payment source | Admitting: Medical-Surgical

## 2020-12-20 ENCOUNTER — Encounter: Payer: Self-pay | Admitting: Medical-Surgical

## 2020-12-20 DIAGNOSIS — E661 Drug-induced obesity: Secondary | ICD-10-CM

## 2021-01-27 ENCOUNTER — Ambulatory Visit: Payer: No Typology Code available for payment source | Admitting: Medical-Surgical

## 2021-01-27 ENCOUNTER — Other Ambulatory Visit: Payer: Self-pay

## 2021-01-27 ENCOUNTER — Encounter: Payer: Self-pay | Admitting: Medical-Surgical

## 2021-01-27 VITALS — BP 120/74 | HR 77 | Temp 98.8°F | Ht 64.0 in | Wt 212.9 lb

## 2021-01-27 DIAGNOSIS — Z6837 Body mass index (BMI) 37.0-37.9, adult: Secondary | ICD-10-CM

## 2021-01-27 DIAGNOSIS — E041 Nontoxic single thyroid nodule: Secondary | ICD-10-CM

## 2021-01-27 DIAGNOSIS — G43009 Migraine without aura, not intractable, without status migrainosus: Secondary | ICD-10-CM

## 2021-01-27 DIAGNOSIS — E661 Drug-induced obesity: Secondary | ICD-10-CM

## 2021-01-27 DIAGNOSIS — R7303 Prediabetes: Secondary | ICD-10-CM | POA: Diagnosis not present

## 2021-01-27 DIAGNOSIS — I10 Essential (primary) hypertension: Secondary | ICD-10-CM

## 2021-01-27 DIAGNOSIS — E785 Hyperlipidemia, unspecified: Secondary | ICD-10-CM

## 2021-01-27 MED ORDER — PHENTERMINE HCL 37.5 MG PO TABS: ORAL_TABLET | ORAL | 0 refills | Status: DC

## 2021-01-27 MED ORDER — TOPIRAMATE 50 MG PO TABS: ORAL_TABLET | ORAL | 0 refills | Status: DC

## 2021-01-27 NOTE — Progress Notes (Signed)
Subjective:    CC: Weight check, elevated blood pressure, migraines  HPI: Pleasant 40 year old female presenting today for the following:  Weight check-was unable to come for her last 4-week follow-up on her weight loss.  Was previously using phentermine 15 mg daily along with Topamax 25 mg twice daily.  Was tolerating both medications well and noted that it did help with her appetite suppression.  She is interested in resuming the medications to continue with weight loss.  Blood pressure-has noted a couple of elevated blood pressures over the past several weeks.  These readings were in the 140s systolically but the elevation is not sustained.  Does note that she has quite a bit of increased stress from being short staffed at work.  Is keeping an eye on her blood pressure and knows to watch for consistent elevations.  Migraines-when she was taking Topamax, she had no headaches.  Unfortunately since stopping it she has started back with headaches.  Notes that her last 1 was several days ago and lasted for 2 days.  She took Excedrin Migraine which was helpful.  I reviewed the past medical history, family history, social history, surgical history, and allergies today and no changes were needed.  Please see the problem list section below in epic for further details.  Past Medical History: Past Medical History:  Diagnosis Date   Anxiety and depression    Hyperlipidemia    Hypertension    Thyroid disease    Past Surgical History: Past Surgical History:  Procedure Laterality Date   ABDOMINAL HYSTERECTOMY     CHOLECYSTECTOMY     INCONTINENCE SURGERY     TUBAL LIGATION     Uterine Ablation     WISDOM TOOTH EXTRACTION     Social History: Social History   Socioeconomic History   Marital status: Married    Spouse name: Not on file   Number of children: Not on file   Years of education: Not on file   Highest education level: Not on file  Occupational History   Not on file  Tobacco  Use   Smoking status: Never   Smokeless tobacco: Never  Vaping Use   Vaping Use: Never used  Substance and Sexual Activity   Alcohol use: No   Drug use: Never   Sexual activity: Yes    Birth control/protection: Surgical  Other Topics Concern   Not on file  Social History Narrative   Not on file   Social Determinants of Health   Financial Resource Strain: Not on file  Food Insecurity: Not on file  Transportation Needs: Not on file  Physical Activity: Not on file  Stress: Not on file  Social Connections: Not on file   Family History: Family History  Problem Relation Age of Onset   Hypertension Mother    Diabetes Mother    Thyroid disease Mother    Hypertension Brother    Thyroid disease Brother    Hypertension Maternal Aunt    Diabetes Maternal Aunt    Skin cancer Maternal Aunt    Thyroid disease Maternal Aunt    Hypertension Maternal Grandmother    Thyroid disease Maternal Grandmother    Hypertension Maternal Grandfather    Skin cancer Maternal Grandfather    Thyroid disease Maternal Grandfather    Allergies: No Known Allergies Medications: See med rec.  Review of Systems: See HPI for pertinent positives and negatives.   Objective:    General: Well Developed, well nourished, and in no acute distress.  Neuro:  Alert and oriented x3.  HEENT: Normocephalic, atraumatic.  Skin: Warm and dry. Cardiac: Regular rate and rhythm.  Respiratory:  Not using accessory muscles, speaking in full sentences.   Impression and Recommendations:     1. 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  Recommend continuing dietary and lifestyle modifications.  2. Hypertension, unspecified type Checking CBC with differential and CMP today. - CBC with Differential/Platelet - CMP and Liver  3. Migraine without aura and without status migrainosus, not intractable Restarting  Topamax.  She was seeing benefit with 25 mg twice daily but felt that the medication could work a little better with appetite suppression.  Increasing to 50 mg twice daily with a slow taper up.  4. Prediabetes Checking CBC with differential and hemoglobin A1c. - CBC with Differential/Platelet - Hemoglobin A1c  5. Thyroid nodule Checking TSH. - TSH  6. Hyperlipidemia, unspecified hyperlipidemia type Checking with the panel.  Continue Crestor 5 mg daily. - Lipid panel  Return in about 4 weeks (around 02/24/2021) for weight check. ___________________________________________ Thayer Ohm, DNP, APRN, FNP-BC Primary Care and Sports Medicine [facility] [place]

## 2021-01-31 LAB — CMP AND LIVER
ALT: 31 IU/L (ref 0–32)
AST: 26 IU/L (ref 0–40)
Albumin: 4.4 g/dL (ref 3.8–4.8)
Alkaline Phosphatase: 96 IU/L (ref 44–121)
BUN: 12 mg/dL (ref 6–24)
Bilirubin Total: 0.6 mg/dL (ref 0.0–1.2)
Bilirubin, Direct: 0.16 mg/dL (ref 0.00–0.40)
CO2: 25 mmol/L (ref 20–29)
Calcium: 9.2 mg/dL (ref 8.7–10.2)
Chloride: 100 mmol/L (ref 96–106)
Creatinine, Ser: 0.83 mg/dL (ref 0.57–1.00)
Glucose: 94 mg/dL (ref 65–99)
Potassium: 3.9 mmol/L (ref 3.5–5.2)
Sodium: 138 mmol/L (ref 134–144)
Total Protein: 6.8 g/dL (ref 6.0–8.5)
eGFR: 91 mL/min/{1.73_m2} (ref >59)

## 2021-01-31 LAB — CBC WITH DIFFERENTIAL/PLATELET
Basophils Absolute: 0.1 10*3/uL (ref 0.0–0.2)
Basos: 1 %
EOS (ABSOLUTE): 0.1 10*3/uL (ref 0.0–0.4)
Eos: 2 %
Hematocrit: 40.5 % (ref 34.0–46.6)
Hemoglobin: 13.2 g/dL (ref 11.1–15.9)
Immature Grans (Abs): 0 10*3/uL (ref 0.0–0.1)
Immature Granulocytes: 0 %
Lymphocytes Absolute: 2.1 10*3/uL (ref 0.7–3.1)
Lymphs: 32 %
MCH: 28.3 pg (ref 26.6–33.0)
MCHC: 32.6 g/dL (ref 31.5–35.7)
MCV: 87 fL (ref 79–97)
Monocytes Absolute: 0.3 10*3/uL (ref 0.1–0.9)
Monocytes: 5 %
Neutrophils Absolute: 3.9 10*3/uL (ref 1.4–7.0)
Neutrophils: 60 %
Platelets: 317 10*3/uL (ref 150–450)
RBC: 4.66 x10E6/uL (ref 3.77–5.28)
RDW: 12.7 % (ref 11.7–15.4)
WBC: 6.5 10*3/uL (ref 3.4–10.8)

## 2021-01-31 LAB — SPECIMEN STATUS REPORT

## 2021-01-31 LAB — LIPID PANEL
Chol/HDL Ratio: 4.4 ratio (ref 0.0–4.4)
Chol/HDL Ratio: 4.5 ratio — ABNORMAL HIGH (ref 0.0–4.4)
Cholesterol, Total: 154 mg/dL (ref 100–199)
Cholesterol, Total: 159 mg/dL (ref 100–199)
HDL: 35 mg/dL — ABNORMAL LOW (ref >39)
HDL: 35 mg/dL — ABNORMAL LOW (ref >39)
LDL Chol Calc (NIH): 95 mg/dL (ref 0–99)
LDL Chol Calc (NIH): 100 mg/dL — ABNORMAL HIGH (ref 0–99)
Triglycerides: 132 mg/dL (ref 0–149)
Triglycerides: 132 mg/dL (ref 0–149)
VLDL Cholesterol Cal: 24 mg/dL (ref 5–40)
VLDL Cholesterol Cal: 24 mg/dL (ref 5–40)

## 2021-01-31 LAB — TSH: TSH: 2.18 u[IU]/mL (ref 0.450–4.500)

## 2021-01-31 LAB — HEMOGLOBIN A1C
Est. average glucose Bld gHb Est-mCnc: 105 mg/dL
Hgb A1c MFr Bld: 5.3 % (ref 4.8–5.6)

## 2021-02-12 ENCOUNTER — Encounter: Payer: Self-pay | Admitting: Medical-Surgical

## 2021-02-12 ENCOUNTER — Other Ambulatory Visit: Payer: Self-pay | Admitting: Medical-Surgical

## 2021-02-12 DIAGNOSIS — I10 Essential (primary) hypertension: Secondary | ICD-10-CM

## 2021-02-13 ENCOUNTER — Other Ambulatory Visit: Payer: Self-pay | Admitting: Medical-Surgical

## 2021-02-13 DIAGNOSIS — I10 Essential (primary) hypertension: Secondary | ICD-10-CM

## 2021-02-13 MED ORDER — LISINOPRIL 20 MG PO TABS: 20.0000 mg | ORAL_TABLET | Freq: Every day | ORAL | 0 refills | Status: DC

## 2021-02-23 ENCOUNTER — Encounter: Payer: Self-pay | Admitting: Medical-Surgical

## 2021-02-23 ENCOUNTER — Other Ambulatory Visit: Payer: Self-pay

## 2021-02-23 ENCOUNTER — Ambulatory Visit: Payer: No Typology Code available for payment source | Admitting: Medical-Surgical

## 2021-02-23 VITALS — BP 123/81 | HR 81 | Temp 98.1°F | Ht 64.0 in | Wt 203.3 lb

## 2021-02-23 DIAGNOSIS — Z6834 Body mass index (BMI) 34.0-34.9, adult: Secondary | ICD-10-CM

## 2021-02-23 DIAGNOSIS — E661 Drug-induced obesity: Secondary | ICD-10-CM | POA: Diagnosis not present

## 2021-02-23 MED ORDER — FLUOXETINE HCL 40 MG PO CAPS: 40.0000 mg | ORAL_CAPSULE | Freq: Every day | ORAL | 1 refills | Status: DC

## 2021-02-23 MED ORDER — TOPIRAMATE 50 MG PO TABS: 50.0000 mg | ORAL_TABLET | Freq: Two times a day (BID) | ORAL | 0 refills | Status: DC

## 2021-02-23 MED ORDER — PHENTERMINE HCL 37.5 MG PO TABS: 37.5000 mg | ORAL_TABLET | Freq: Every day | ORAL | 0 refills | Status: DC

## 2021-02-23 NOTE — Progress Notes (Signed)
Subjective:    CC: Weight check  HPI: Pleasant 40 year old female presenting today for weight check on phentermine and Topamax.  She took phentermine one half tab daily for 1 week but did not see much benefit so she increased it to 1 whole tab of 37.5 mg daily.  She has been taking this, tolerating well without side effects.  She is also taking Topamax and is up to 50 mg twice daily.  Notes that she is tolerating this as well and has had no side effects.  Does experience appetite suppression and has cut back on portion sizes.  She is trying to cut out sodas as well as concentrated sweets.  She is doing regular exercise but is focusing mainly on cardiovascular activities such as walking/treadmill.  Denies difficulty with sleeping, nausea, vomiting, or diarrhea.  Has noticed a definite difference in the way her clothes are fitting and is very happy with this.  I reviewed the past medical history, family history, social history, surgical history, and allergies today and no changes were needed.  Please see the problem list section below in epic for further details.  Past Medical History: Past Medical History:  Diagnosis Date   Anxiety and depression    Hyperlipidemia    Hypertension    Thyroid disease    Past Surgical History: Past Surgical History:  Procedure Laterality Date   ABDOMINAL HYSTERECTOMY     CHOLECYSTECTOMY     INCONTINENCE SURGERY     TUBAL LIGATION     Uterine Ablation     WISDOM TOOTH EXTRACTION     Social History: Social History   Socioeconomic History   Marital status: Married    Spouse name: Not on file   Number of children: Not on file   Years of education: Not on file   Highest education level: Not on file  Occupational History   Not on file  Tobacco Use   Smoking status: Never   Smokeless tobacco: Never  Vaping Use   Vaping Use: Never used  Substance and Sexual Activity   Alcohol use: No   Drug use: Never   Sexual activity: Yes    Birth  control/protection: Surgical  Other Topics Concern   Not on file  Social History Narrative   Not on file   Social Determinants of Health   Financial Resource Strain: Not on file  Food Insecurity: Not on file  Transportation Needs: Not on file  Physical Activity: Not on file  Stress: Not on file  Social Connections: Not on file   Family History: Family History  Problem Relation Age of Onset   Hypertension Mother    Diabetes Mother    Thyroid disease Mother    Hypertension Brother    Thyroid disease Brother    Hypertension Maternal Aunt    Diabetes Maternal Aunt    Skin cancer Maternal Aunt    Thyroid disease Maternal Aunt    Hypertension Maternal Grandmother    Thyroid disease Maternal Grandmother    Hypertension Maternal Grandfather    Skin cancer Maternal Grandfather    Thyroid disease Maternal Grandfather    Allergies: No Known Allergies Medications: See med rec.  Review of Systems: See HPI for pertinent positives and negatives.   Objective:    General: Well Developed, well nourished, and in no acute distress.  Neuro: Alert and oriented x3.  HEENT: Normocephalic, atraumatic.  Skin: Warm and dry. Cardiac: Regular rate and rhythm, no murmurs rubs or gallops, no lower extremity edema.  Respiratory: Clear to auscultation bilaterally. Not using accessory muscles, speaking in full sentences.  Impression and Recommendations:    1. Class 1 drug-induced obesity with serious comorbidity and body mass index (BMI) of 34.0 to 34.9 in adult She has had approximately 9 pound weight loss in the last 4 weeks.  Doing very well with low calorie diet and increased activity.  Continue phentermine 37.5 mg daily and Topamax 50 mg twice daily.  Discussed evaluating dietary intake for adequate protein as well as adding weightbearing exercises to her routine.  Return in about 4 weeks (around 03/23/2021) for weight check.  ___________________________________________ Thayer Ohm, DNP,  APRN, FNP-BC Primary Care and Sports Medicine [facility] [place]

## 2021-03-23 ENCOUNTER — Ambulatory Visit: Payer: No Typology Code available for payment source | Admitting: Medical-Surgical

## 2021-03-31 ENCOUNTER — Encounter: Payer: Self-pay | Admitting: Medical-Surgical

## 2021-03-31 ENCOUNTER — Ambulatory Visit: Payer: No Typology Code available for payment source | Admitting: Medical-Surgical

## 2021-03-31 ENCOUNTER — Other Ambulatory Visit: Payer: Self-pay

## 2021-03-31 VITALS — BP 114/76 | HR 91 | Resp 20 | Ht 64.0 in | Wt 206.0 lb

## 2021-03-31 DIAGNOSIS — Z6834 Body mass index (BMI) 34.0-34.9, adult: Secondary | ICD-10-CM | POA: Diagnosis not present

## 2021-03-31 DIAGNOSIS — E661 Drug-induced obesity: Secondary | ICD-10-CM | POA: Diagnosis not present

## 2021-03-31 MED ORDER — PHENTERMINE HCL 37.5 MG PO TABS: 37.5000 mg | ORAL_TABLET | Freq: Every day | ORAL | 0 refills | Status: DC

## 2021-03-31 NOTE — Progress Notes (Signed)
  HPI with pertinent ROS:   CC: Weight check  HPI: Pleasant 40 year old female presenting today for weight check on phentermine 37.5 mg daily with Topamax 50 mg twice daily.  She has been taking both medications, tolerating well without side effects.  She did go on vacation last week to [facility] and admits that her diet was not as controlled and she did not do her regular exercising.  She did walk a lot while she was there.  Prior to her vacation, she had been doing weights 2-3 times weekly as well as cardio 3-4 times weekly.  Today, she notes some abdominal bloating related to her recent dietary excesses.  She does feel like her clothes are fitting better overall.  She is weighing every day.  She is logging her food and my fitness pal and weighs her meets on a food scale.  She uses measuring cups to measure her fruits and vegetables rather than weighing them.  As a benefit, she notes that she has had no more headaches since starting the Topamax.  Denies fever, chills, shortness of breath, chest pain, palpitations, and constipation.  I reviewed the past medical history, family history, social history, surgical history, and allergies today and no changes were needed.  Please see the problem list section below in epic for further details.   Physical exam:   General: Well Developed, well nourished, and in no acute distress.  Neuro: Alert and oriented x3.  HEENT: Normocephalic, atraumatic.  Skin: Warm and dry. Cardiac: Regular rate and rhythm, no murmurs rubs or gallops, no lower extremity edema.  Respiratory: Clear to auscultation bilaterally. Not using accessory muscles, speaking in full sentences.  Impression and Recommendations:    1. Class 1 drug-induced obesity with serious comorbidity and body mass index (BMI) of 34.0 to 34.9 in adult Unfortunately she has gained 3 pounds but I feel this is more in relation to strength training as well as her dietary excesses on vacation.  Recommend  returning to her previous dietary limitations and continue regular exercise.  May benefit from doing weights 3 days a week and cardio 3 days a week rather than focusing more on cardio.  Recommend using a food scale to weigh her food rather than using measuring cups since weighing it will be more accurate.  Continue logging foods into my fitness pal and work to stay within the recommended macro parameters for her weight.  If no further weight loss with phentermine in the next 4 weeks, we will have to look at alternative options.  Patient verbalized understanding and is agreeable to the plan.  Return in about 4 weeks (around 04/28/2021) for weight check. ___________________________________________ Thayer Ohm, DNP, APRN, FNP-BC Primary Care and Sports Medicine [facility] [place]

## 2021-05-02 ENCOUNTER — Encounter: Payer: Self-pay | Admitting: Medical-Surgical

## 2021-05-02 NOTE — Telephone Encounter (Signed)
Spoke with pt who states that symptoms began one week ago with bright red rectal bleeding.  Pt states that it is similar to the amount of blood she has with menses.  Pt also states that she is very tired and lethargic and just doesn't feel good.  Pt states that yesterday she began having some abdominal pain, but that it's not been intense.  Pt given appt with Dr. Linford Arnold for tomorrow with instructions to proceed to ED or UC if she develops any RED FLAG symptoms, which were provided to the patient.  Pt expressed understanding and is agreeable.  Tiajuana Amass, CMA

## 2021-05-03 ENCOUNTER — Ambulatory Visit: Payer: No Typology Code available for payment source | Admitting: Family Medicine

## 2021-05-03 ENCOUNTER — Encounter: Payer: Self-pay | Admitting: Family Medicine

## 2021-05-03 ENCOUNTER — Other Ambulatory Visit: Payer: Self-pay | Admitting: Medical-Surgical

## 2021-05-03 VITALS — BP 148/68 | HR 78 | Ht 64.0 in | Wt 209.0 lb

## 2021-05-03 DIAGNOSIS — K649 Unspecified hemorrhoids: Secondary | ICD-10-CM

## 2021-05-03 DIAGNOSIS — K625 Hemorrhage of anus and rectum: Secondary | ICD-10-CM

## 2021-05-03 DIAGNOSIS — R42 Dizziness and giddiness: Secondary | ICD-10-CM | POA: Diagnosis not present

## 2021-05-03 LAB — CBC WITH DIFFERENTIAL/PLATELET
Absolute Monocytes: 487 cells/uL (ref 200–950)
Basophils Absolute: 76 cells/uL (ref 0–200)
Basophils Relative: 0.9 %
Eosinophils Absolute: 101 cells/uL (ref 15–500)
Eosinophils Relative: 1.2 %
HCT: 42.5 % (ref 35.0–45.0)
Hemoglobin: 14.4 g/dL (ref 11.7–15.5)
Lymphs Abs: 2722 cells/uL (ref 850–3900)
MCH: 28.9 pg (ref 27.0–33.0)
MCHC: 33.9 g/dL (ref 32.0–36.0)
MCV: 85.3 fL (ref 80.0–100.0)
MPV: 9.7 fL (ref 7.5–12.5)
Monocytes Relative: 5.8 %
Neutro Abs: 5015 cells/uL (ref 1500–7800)
Neutrophils Relative %: 59.7 %
Platelets: 354 10*3/uL (ref 140–400)
RBC: 4.98 10*6/uL (ref 3.80–5.10)
RDW: 12 % (ref 11.0–15.0)
Total Lymphocyte: 32.4 %
WBC: 8.4 10*3/uL (ref 3.8–10.8)

## 2021-05-03 MED ORDER — HYDROCORTISONE ACETATE 25 MG RE SUPP: 25.0000 mg | Freq: Two times a day (BID) | RECTAL | 1 refills | Status: DC

## 2021-05-03 MED ORDER — HYDROCORTISONE (PERIANAL) 2.5 % EX CREA: 1.0000 "application " | TOPICAL_CREAM | Freq: Two times a day (BID) | CUTANEOUS | 2 refills | Status: DC

## 2021-05-03 NOTE — Progress Notes (Signed)
Acute Office Visit  Subjective:    Patient ID: Anita Heath, female    DOB: 03/18/1981, 40 y.o.   MRN: [id number]  No chief complaint on file.   HPI Patient is in today for blood in stool and rectal pain x 1 week.  In the last couple of days she has had a little bit of loose stool but not initially.  She denies any constipation.  No fevers or chills.  She says she has been passing clots and then blood into the toilet dripping into the toilet.  She says she is also felt a little lightheaded and weak and tired the last 2 days.   Past Medical History:  Diagnosis Date   Anxiety and depression    Hyperlipidemia    Hypertension    Thyroid disease     Past Surgical History:  Procedure Laterality Date   ABDOMINAL HYSTERECTOMY     CHOLECYSTECTOMY     INCONTINENCE SURGERY     TUBAL LIGATION     Uterine Ablation     WISDOM TOOTH EXTRACTION      Family History  Problem Relation Age of Onset   Hypertension Mother    Diabetes Mother    Thyroid disease Mother    Hypertension Brother    Thyroid disease Brother    Hypertension Maternal Aunt    Diabetes Maternal Aunt    Skin cancer Maternal Aunt    Thyroid disease Maternal Aunt    Hypertension Maternal Grandmother    Thyroid disease Maternal Grandmother    Hypertension Maternal Grandfather    Skin cancer Maternal Grandfather    Thyroid disease Maternal Grandfather     Social History   Socioeconomic History   Marital status: Married    Spouse name: Not on file   Number of children: Not on file   Years of education: Not on file   Highest education level: Not on file  Occupational History   Not on file  Tobacco Use   Smoking status: Never   Smokeless tobacco: Never  Vaping Use   Vaping Use: Never used  Substance and Sexual Activity   Alcohol use: No   Drug use: Never   Sexual activity: Yes    Birth control/protection: Surgical  Other Topics Concern   Not on file  Social History Narrative   Not on file   Social  Determinants of Health   Financial Resource Strain: Not on file  Food Insecurity: Not on file  Transportation Needs: Not on file  Physical Activity: Not on file  Stress: Not on file  Social Connections: Not on file  Intimate Partner Violence: Not on file    Outpatient Medications Prior to Visit  Medication Sig Dispense Refill   Ascorbic Acid (VITAMIN C) 1000 MG tablet Take 1,000 mg by mouth daily.     Cholecalciferol 50 MCG (2000 UT) CAPS Take 1 capsule by mouth daily.     FLUoxetine (PROZAC) 40 MG capsule Take 1 capsule (40 mg total) by mouth daily. 90 capsule 1   lisinopril (ZESTRIL) 20 MG tablet Take 1 tablet (20 mg total) by mouth daily. 90 tablet 0   Magnesium 250 MG TABS Take 1 tablet by mouth daily.     methimazole (TAPAZOLE) 5 MG tablet Take 1 tablet by mouth once daily 90 tablet 0   rosuvastatin (CRESTOR) 5 MG tablet Take 1 tablet (5 mg total) by mouth daily. 90 tablet 1   topiramate (TOPAMAX) 50 MG tablet Take 1 tablet (50 mg  total) by mouth 2 (two) times daily. 180 tablet 0   phentermine (ADIPEX-P) 37.5 MG tablet Take 1 tablet (37.5 mg total) by mouth daily before breakfast. 30 tablet 0   No facility-administered medications prior to visit.    No Known Allergies  Review of Systems     Objective:    Physical Exam Constitutional:      Appearance: She is well-developed.  HENT:     Head: Normocephalic and atraumatic.  Cardiovascular:     Rate and Rhythm: Normal rate and regular rhythm.     Heart sounds: Normal heart sounds.  Pulmonary:     Effort: Pulmonary effort is normal.     Breath sounds: Normal breath sounds.  Skin:    General: Skin is warm and dry.  Neurological:     Mental Status: She is alert and oriented to person, place, and time.  Psychiatric:        Behavior: Behavior normal.    BP (!) 148/68   Pulse 78   Ht '5\' 4"'  (1.626 m)   Wt 209 lb (94.8 kg)   SpO2 98%   BMI 35.87 kg/m  Wt Readings from Last 3 Encounters:  05/03/21 209 lb (94.8 kg)   03/31/21 206 lb (93.4 kg)  02/23/21 203 lb 4.8 oz (92.2 kg)    Health Maintenance Due  Topic Date Due   PAP SMEAR-Modifier  12/20/2013   COVID-19 Vaccine (3 - Booster for Pfizer series) 09/29/2020   INFLUENZA VACCINE  03/20/2021    There are no preventive care reminders to display for this patient.   Lab Results  Component Value Date   TSH 2.180 01/30/2021   Lab Results  Component Value Date   WBC 6.5 01/30/2021   HGB 13.2 01/30/2021   HCT 40.5 01/30/2021   MCV 87 01/30/2021   PLT 317 01/30/2021   Lab Results  Component Value Date   NA 138 01/30/2021   K 3.9 01/30/2021   CO2 25 01/30/2021   GLUCOSE 94 01/30/2021   BUN 12 01/30/2021   CREATININE 0.83 01/30/2021   BILITOT 0.6 01/30/2021   ALKPHOS 96 01/30/2021   AST 26 01/30/2021   ALT 31 01/30/2021   PROT 6.8 01/30/2021   ALBUMIN 4.4 01/30/2021   CALCIUM 9.2 01/30/2021   EGFR 91 01/30/2021   Lab Results  Component Value Date   CHOL 159 01/30/2021   CHOL 154 01/30/2021   Lab Results  Component Value Date   HDL 35 (L) 01/30/2021   HDL 35 (L) 01/30/2021   Lab Results  Component Value Date   LDLCALC 100 (H) 01/30/2021   LDLCALC 95 01/30/2021   Lab Results  Component Value Date   TRIG 132 01/30/2021   TRIG 132 01/30/2021   Lab Results  Component Value Date   CHOLHDL 4.5 (H) 01/30/2021   CHOLHDL 4.4 01/30/2021   Lab Results  Component Value Date   HGBA1C 5.3 01/30/2021       Assessment & Plan:   Problem List Items Addressed This Visit   None Visit Diagnoses     Rectal bleeding    -  Primary   Relevant Medications   hydrocortisone (ANUSOL-HC) 25 MG suppository   hydrocortisone (PROCTOSOL HC) 2.5 % rectal cream   Other Relevant Orders   CBC with Differential/Platelet   Ambulatory referral to General Surgery   Hemorrhoids, unspecified hemorrhoid type       Relevant Medications   hydrocortisone (ANUSOL-HC) 25 MG suppository   hydrocortisone (PROCTOSOL HC) 2.5 %  rectal cream   Other  Relevant Orders   Ambulatory referral to General Surgery   Lightheadedness          She has had pretty significant rectal bleeding she was able to show me on some photographs some of the larger clots that she was passing rectally.  On exam she does have some hemorrhoids that are mildly swollen.  No thromboses.  I did see a excoriated area in the rectal area where there was a little bit of fresh blood.  But no source of large amount of bleeding.  At this point were going to use a topical steroid cream and recommend she get over-the-counter lidocaine just for comfort since she is quite tender.  Number to go ahead and refer her to general surgery for consultation for more definitive treatments.  She is also felt a little lightheaded and weak the last couple of days so I do want a make sure that she has not had a significant drop in hemoglobin so organ to check a CBC today.  Meds ordered this encounter  Medications   hydrocortisone (ANUSOL-HC) 25 MG suppository    Sig: Place 1 suppository (25 mg total) rectally 2 (two) times daily.    Dispense:  12 suppository    Refill:  1   hydrocortisone (PROCTOSOL HC) 2.5 % rectal cream    Sig: Place 1 application rectally 2 (two) times daily.    Dispense:  30 g    Refill:  2      Beatrice Lecher, MD

## 2021-05-03 NOTE — Progress Notes (Signed)
Rectal bleeding x1 wk denies f/s/c. Some nausea and loose stools but not diarrhea She has noticed bright red blood in toilet and when wiping and clots.

## 2021-05-04 NOTE — Progress Notes (Signed)
Hi Anita Heath,   Good news, your hemoglobin is normal which is great so no sign of anemia.  Really would encourage you to drink plenty of fluids and try to get some rest.

## 2021-05-05 ENCOUNTER — Ambulatory Visit: Payer: No Typology Code available for payment source | Admitting: Medical-Surgical

## 2021-05-09 ENCOUNTER — Encounter: Payer: Self-pay | Admitting: Medical-Surgical

## 2021-05-28 ENCOUNTER — Other Ambulatory Visit: Payer: Self-pay | Admitting: Medical-Surgical

## 2021-05-28 DIAGNOSIS — I10 Essential (primary) hypertension: Secondary | ICD-10-CM

## 2021-05-29 MED ORDER — LISINOPRIL 20 MG PO TABS: 20.0000 mg | ORAL_TABLET | Freq: Every day | ORAL | 0 refills | Status: DC

## 2021-05-29 MED ORDER — METHIMAZOLE 5 MG PO TABS: 5.0000 mg | ORAL_TABLET | Freq: Every day | ORAL | 0 refills | Status: DC

## 2021-06-19 ENCOUNTER — Encounter: Payer: Self-pay | Admitting: Medical-Surgical

## 2021-06-19 ENCOUNTER — Encounter: Payer: Self-pay | Admitting: Nurse Practitioner

## 2021-06-20 ENCOUNTER — Encounter: Payer: Self-pay | Admitting: Medical-Surgical

## 2021-06-20 ENCOUNTER — Telehealth (INDEPENDENT_AMBULATORY_CARE_PROVIDER_SITE_OTHER): Payer: No Typology Code available for payment source | Admitting: Medical-Surgical

## 2021-06-20 DIAGNOSIS — K529 Noninfective gastroenteritis and colitis, unspecified: Secondary | ICD-10-CM

## 2021-06-20 DIAGNOSIS — R110 Nausea: Secondary | ICD-10-CM | POA: Diagnosis not present

## 2021-06-20 DIAGNOSIS — K625 Hemorrhage of anus and rectum: Secondary | ICD-10-CM | POA: Diagnosis not present

## 2021-06-20 MED ORDER — ONDANSETRON 8 MG PO TBDP: 8.0000 mg | ORAL_TABLET | Freq: Three times a day (TID) | ORAL | 3 refills | Status: DC | PRN

## 2021-06-20 NOTE — Progress Notes (Signed)
Virtual Visit via Video Note  I connected with Anita Heath on 06/20/21 at  2:00 PM EDT by a video enabled telemedicine application and verified that I am speaking with the correct person using two identifiers.   I discussed the limitations of evaluation and management by telemedicine and the availability of in person appointments. The patient expressed understanding and agreed to proceed.  Patient location: home Provider locations: office  Subjective:    CC: Nausea, diarrhea, abdominal cramping   HPI: Pleasant 40 year old female presenting via MyChart video visit with reports of GI symptoms that started yesterday and include diarrhea, nausea, vomiting, and abdominal cramping.  Today she has a fever of 101.8 accompanied by chills.  She is having hourly bowel movements.  Denies hematochezia, melena, hematemesis, and upper respiratory symptoms.  Notes that a few weeks ago, she was seen for rectal bleeding and referred to [facility] surgery.  She has not heard back from this.  With her new symptoms, she felt like she needed to be evaluated by GI so she has already reached out to [facility] and has an appointment scheduled with them.   Past medical history, Surgical history, Family history not pertinant except as noted below, Social history, Allergies, and medications have been entered into the medical record, reviewed, and corrections made.   Review of Systems: See HPI for pertinent positives and negatives.   Objective:    General: Speaking clearly in complete sentences without any shortness of breath.  Alert and oriented x3.  Normal judgment. No apparent acute distress.  Impression and Recommendations:    1. Gastroenteritis, acute 2. Nausea Symptoms consistent with a viral GI.  Okay to use Tylenol/ibuprofen as needed for fever and discomfort.  Recommend avoiding antidiarrheals as this is the body's way of getting rid of infectious agents.  Sending in Zofran for nausea.  Reviewed  the brat diet and recommend increasing p.o. fluids as well as small frequent meals.  3. Rectal bleeding Referral entered for gastroenterology to make sure her insurance will cover this. - Ambulatory referral to Gastroenterology  I discussed the assessment and treatment plan with the patient. The patient was provided an opportunity to ask questions and all were answered. The patient agreed with the plan and demonstrated an understanding of the instructions.   The patient was advised to call back or seek an in-person evaluation if the symptoms worsen or if the condition fails to improve as anticipated.  20 minutes of non-face-to-face time was provided during this encounter.  Return if symptoms worsen or fail to improve.  Thayer Ohm, DNP, APRN, FNP-BC [facility]

## 2021-06-20 NOTE — Progress Notes (Signed)
Feels very nauseous, diarrhea, cramping, low grade fever Started yesterday

## 2021-06-20 NOTE — Addendum Note (Signed)
Addended byChristen Butter on: 06/20/2021 06:07 PM   Modules accepted: Level of Service

## 2021-07-06 ENCOUNTER — Ambulatory Visit: Payer: No Typology Code available for payment source | Admitting: Nurse Practitioner

## 2021-07-20 NOTE — Telephone Encounter (Signed)
Referral was never received I resent it today - CF

## 2021-08-16 ENCOUNTER — Other Ambulatory Visit: Payer: Self-pay | Admitting: Medical-Surgical

## 2021-08-16 DIAGNOSIS — I10 Essential (primary) hypertension: Secondary | ICD-10-CM

## 2021-08-17 MED ORDER — METHIMAZOLE 5 MG PO TABS
5.0000 mg | ORAL_TABLET | Freq: Every day | ORAL | 0 refills | Status: DC
Start: 2021-08-17 — End: 2021-11-21

## 2021-08-17 MED ORDER — TOPIRAMATE 50 MG PO TABS: 50.0000 mg | ORAL_TABLET | Freq: Two times a day (BID) | ORAL | 0 refills | Status: DC

## 2021-08-17 MED ORDER — ROSUVASTATIN CALCIUM 5 MG PO TABS: 5.0000 mg | ORAL_TABLET | Freq: Every day | ORAL | 0 refills | Status: DC

## 2021-08-17 MED ORDER — LISINOPRIL 20 MG PO TABS: 20.0000 mg | ORAL_TABLET | Freq: Every day | ORAL | 0 refills | Status: DC

## 2021-08-17 NOTE — Telephone Encounter (Signed)
Patient has been scheduled with Joy on 09/21/2021.

## 2021-08-17 NOTE — Telephone Encounter (Signed)
Please call pt to schedule appt.  No further refills until pt is seen.  T. Jennye Runquist, CMA  

## 2021-09-08 ENCOUNTER — Encounter: Payer: Self-pay | Admitting: Medical-Surgical

## 2021-09-08 ENCOUNTER — Ambulatory Visit: Payer: No Typology Code available for payment source | Admitting: Medical-Surgical

## 2021-09-08 ENCOUNTER — Other Ambulatory Visit: Payer: Self-pay

## 2021-09-08 VITALS — BP 136/87 | HR 84 | Wt 215.0 lb

## 2021-09-08 DIAGNOSIS — G43009 Migraine without aura, not intractable, without status migrainosus: Secondary | ICD-10-CM | POA: Diagnosis not present

## 2021-09-08 MED ORDER — PROMETHAZINE HCL 25 MG/ML IJ SOLN
25.0000 mg | Freq: Once | INTRAMUSCULAR | Status: AC
  Administered 2021-09-08: 25 mg via INTRAMUSCULAR

## 2021-09-08 MED ORDER — DEXAMETHASONE SODIUM PHOSPHATE 10 MG/ML IJ SOLN
10.0000 mg | Freq: Once | INTRAMUSCULAR | Status: AC
  Administered 2021-09-08: 10 mg via INTRAMUSCULAR

## 2021-09-08 MED ORDER — SUMATRIPTAN SUCCINATE 50 MG PO TABS: 50.0000 mg | ORAL_TABLET | Freq: Once | ORAL | 0 refills | Status: DC

## 2021-09-08 MED ORDER — KETOROLAC TROMETHAMINE 60 MG/2ML IM SOLN
60.0000 mg | Freq: Once | INTRAMUSCULAR | Status: AC
  Administered 2021-09-08: 60 mg via INTRAMUSCULAR

## 2021-09-08 MED ORDER — PROMETHAZINE HCL 25 MG PO TABS: 25.0000 mg | ORAL_TABLET | Freq: Three times a day (TID) | ORAL | 0 refills | Status: DC | PRN

## 2021-09-08 MED ORDER — TOPIRAMATE 50 MG PO TABS: 50.0000 mg | ORAL_TABLET | Freq: Two times a day (BID) | ORAL | 1 refills | Status: DC

## 2021-09-08 NOTE — Progress Notes (Signed)
°  HPI with pertinent ROS:   CC: Migraine  HPI: Pleasant 41 year old female presenting today with reports of a migraine that started 4 days ago and has been gradually getting worse since then.  She notes that she has headaches a couple of times a month but they usually respond to conservative measures.  This time the headache has not gone away despite resuming the use of Topamax and trying Tylenol, Advil, and Excedrin Migraine.  She also tried 1 dose of BC powder but got no relief.  She is currently experiencing photophobia, phonophobia, and nausea.  Her pain is located on the left side of her face surrounding her eye and temple.  She had stopped taking Topamax but had some left at home and has been taking 50 mg twice daily.  She has Zofran at home but this is not as helpful as Phenergan when she has a migraine.  I reviewed the past medical history, family history, social history, surgical history, and allergies today and no changes were needed.  Please see the problem list section below in epic for further details.   Physical exam:   Physical Exam Constitutional:      General: She is not in acute distress.    Appearance: Normal appearance. She is ill-appearing. She is not toxic-appearing or diaphoretic.  HENT:     Head: Normocephalic and atraumatic.  Eyes:     General: No scleral icterus.       Right eye: No discharge.        Left eye: No discharge.     Extraocular Movements: Extraocular movements intact.     Conjunctiva/sclera: Conjunctivae normal.     Pupils: Pupils are equal, round, and reactive to light.  Cardiovascular:     Rate and Rhythm: Normal rate and regular rhythm.     Pulses: Normal pulses.     Heart sounds: Normal heart sounds. No murmur heard.   No friction rub. No gallop.  Pulmonary:     Effort: Pulmonary effort is normal. No respiratory distress.     Breath sounds: Normal breath sounds.  Skin:    General: Skin is warm and dry.  Neurological:     Mental Status: She  is alert.  Psychiatric:        Mood and Affect: Mood normal.        Behavior: Behavior normal.        Thought Content: Thought content normal.        Judgment: Judgment normal.     Impression and Recommendations:    1. Migraine without aura and without status migrainosus, not intractable In office injections of Decadron 10 mg, Toradol 60 mg, and Phenergan 25 mg completed.  Continue Topamax 50 mg twice daily.  Sending in Imitrex for migraine abortive therapy.  Sending in a prescription for Phenergan since this seems to be more effective than Zofran.  Return if symptoms worsen or fail to improve. ___________________________________________ Clearnce Sorrel, DNP, APRN, FNP-BC Primary Care and [facility]

## 2021-09-21 ENCOUNTER — Encounter: Payer: Self-pay | Admitting: Medical-Surgical

## 2021-09-21 ENCOUNTER — Ambulatory Visit: Payer: No Typology Code available for payment source | Admitting: Medical-Surgical

## 2021-09-21 ENCOUNTER — Other Ambulatory Visit: Payer: Self-pay

## 2021-09-21 VITALS — BP 131/78 | HR 80 | Resp 20 | Ht 64.0 in | Wt 218.0 lb

## 2021-09-21 DIAGNOSIS — F418 Other specified anxiety disorders: Secondary | ICD-10-CM

## 2021-09-21 DIAGNOSIS — G43009 Migraine without aura, not intractable, without status migrainosus: Secondary | ICD-10-CM

## 2021-09-21 DIAGNOSIS — E782 Mixed hyperlipidemia: Secondary | ICD-10-CM

## 2021-09-21 DIAGNOSIS — I10 Essential (primary) hypertension: Secondary | ICD-10-CM | POA: Diagnosis not present

## 2021-09-21 MED ORDER — PROPRANOLOL HCL ER 60 MG PO CP24: 60.0000 mg | ORAL_CAPSULE | Freq: Every day | ORAL | 1 refills | Status: DC

## 2021-09-21 MED ORDER — ROSUVASTATIN CALCIUM 5 MG PO TABS: 5.0000 mg | ORAL_TABLET | Freq: Every day | ORAL | 1 refills | Status: DC

## 2021-09-21 MED ORDER — SUMATRIPTAN SUCCINATE 100 MG PO TABS: 100.0000 mg | ORAL_TABLET | Freq: Once | ORAL | 0 refills | Status: DC

## 2021-09-21 MED ORDER — FLUOXETINE HCL 40 MG PO CAPS: 40.0000 mg | ORAL_CAPSULE | Freq: Every day | ORAL | 1 refills | Status: DC

## 2021-09-21 MED ORDER — LISINOPRIL 20 MG PO TABS: 20.0000 mg | ORAL_TABLET | Freq: Every day | ORAL | 1 refills | Status: DC

## 2021-09-21 NOTE — Progress Notes (Signed)
HPI with pertinent ROS:   CC: medication refill  HPI: Pleasant 41 year old female presenting today for the following:  Migraines-has been taking Topamax as prescribed however has noted that she started having emotional upset, crying for no reason, and feeling more depressed than usual.  Notes that she had no motivation when she was taking this medication without the addition of phentermine.  Stopped the Topamax yesterday and notes that her headaches are starting to come back.  She did try Imitrex 50 mg tablets and had to take both doses to get any relief.  This did help with photophobia but did not fully resolve the headache.  Notes that her headache today is still lingering but mild.  Has never tried any other preventative medications for migraines.  Mood-taken fluoxetine 40 mg daily, tolerating well without side effects.  Has been on this long-term and feels the medication works well for her.  Unsure if the medication is or if the Topamax is the reason for her recent emotional upset described above.  Denies SI/HI.  Blood pressure-taking lisinopril 20 mg daily, tolerating well without side effects. Denies CP, SOB, palpitations, lower extremity edema, dizziness, headaches, or vision changes.  Hyperlipidemia-taking Crestor 5 mg daily, tolerating well without side effects.  I reviewed the past medical history, family history, social history, surgical history, and allergies today and no changes were needed.  Please see the problem list section below in epic for further details.   Physical exam:   General: Well Developed, well nourished, and in no acute distress.  Neuro: Alert and oriented x3.  HEENT: Normocephalic, atraumatic.  Skin: Warm and dry. Cardiac: Regular rate and rhythm, no murmurs rubs or gallops, no lower extremity edema.  Respiratory: Clear to auscultation bilaterally. Not using accessory muscles, speaking in full sentences.  Impression and Recommendations:    1. Anxiety with  depression Unclear etiology for recent emotional upset but timing does correlate with use of Topamax.  Discontinuing Topamax.  Continue fluoxetine 40 mg daily.  After 4 to 6 weeks, we will reevaluate and see if her symptoms have improved or if there is a need for dosage adjustment. - FLUoxetine (PROZAC) 40 MG capsule; Take 1 capsule (40 mg total) by mouth daily.  Dispense: 90 capsule; Refill: 1  2. Hypertension, unspecified type Blood pressure at goal today.  Continue lisinopril 20 mg daily as prescribed. - lisinopril (ZESTRIL) 20 MG tablet; Take 1 tablet (20 mg total) by mouth daily.  Dispense: 90 tablet; Refill: 1  3. Mixed hyperlipidemia Continue Crestor 5 mg daily - rosuvastatin (CRESTOR) 5 MG tablet; Take 1 tablet (5 mg total) by mouth daily.  Dispense: 90 tablet; Refill: 1  4. Migraine without aura and without status migrainosus, not intractable Discontinue Topamax.  Start propranolol ER 60 mg daily.  This may help with blood pressure, heart rate, as well as mood and migraine.  Discussed possible side effects.  Increasing sumatriptan to 100 mg tablets, may repeat in 2 hours with a max dose of 200 mg/day.  Continue Phenergan and Zofran for associated nausea. - propranolol ER (INDERAL LA) 60 MG 24 hr capsule; Take 1 capsule (60 mg total) by mouth daily.  Dispense: 90 capsule; Refill: 1 - SUMAtriptan (IMITREX) 100 MG tablet; Take 1 tablet (100 mg total) by mouth once for 1 dose. May repeat in 2 hours if headache persists or recurs.  Dispense: 10 tablet; Refill: 0  Return in about 6 weeks (around 11/02/2021) for mood follow up. ___________________________________________ Clearnce Sorrel, DNP, APRN,  FNP-BC Primary Care and [place]

## 2021-11-02 ENCOUNTER — Ambulatory Visit: Payer: No Typology Code available for payment source | Admitting: Medical-Surgical

## 2021-11-18 ENCOUNTER — Other Ambulatory Visit: Payer: Self-pay | Admitting: Medical-Surgical

## 2021-12-14 ENCOUNTER — Encounter: Payer: Self-pay | Admitting: Medical-Surgical

## 2021-12-14 ENCOUNTER — Ambulatory Visit: Payer: No Typology Code available for payment source | Admitting: Medical-Surgical

## 2021-12-14 ENCOUNTER — Telehealth: Payer: Self-pay | Admitting: Medical-Surgical

## 2021-12-14 VITALS — BP 140/83 | HR 71 | Resp 20 | Ht 64.0 in | Wt 228.7 lb

## 2021-12-14 DIAGNOSIS — G43009 Migraine without aura, not intractable, without status migrainosus: Secondary | ICD-10-CM

## 2021-12-14 MED ORDER — PROMETHAZINE HCL 25 MG/ML IJ SOLN
25.0000 mg | Freq: Once | INTRAMUSCULAR | Status: AC
  Administered 2021-12-14: 25 mg via INTRAMUSCULAR

## 2021-12-14 MED ORDER — DEXAMETHASONE SODIUM PHOSPHATE 10 MG/ML IJ SOLN
10.0000 mg | Freq: Once | INTRAMUSCULAR | Status: AC
  Administered 2021-12-14: 10 mg via INTRAMUSCULAR

## 2021-12-14 MED ORDER — KETOROLAC TROMETHAMINE 30 MG/ML IJ SOLN
30.0000 mg | Freq: Once | INTRAMUSCULAR | Status: AC
  Administered 2021-12-14: 30 mg via INTRAMUSCULAR

## 2021-12-14 NOTE — Progress Notes (Signed)
?  HPI with pertinent ROS:  ? ?CC: Migraine ? ?HPI: ?Pleasant 41 year old female presenting today with reports of a migraine headache that started over the weekend.  She was out of town and away from her home and was only able to access Excedrin Migraine which she took without relief.  Her headache continued to worsen and has not responded to 3 different doses of Imitrex 100 mg.  She took her first dose when she arrived home on Monday evening and follow that up 2 hours later with the second dose without relief.  She took another dose yesterday evening without relief.  Notes that her headache is accompanied by blurred vision, nausea, photophobia, and generalized malaise.  Rates her headache pain at 8/10 currently.  She had her son drive her here today.  Has had no other neurological changes or concerns. ? ?I reviewed the past medical history, family history, social history, surgical history, and allergies today and no changes were needed.  Please see the problem list section below in epic for further details. ? ? ?Physical exam:  ? ?General: Well Developed, well nourished, and in no acute distress.  ?Neuro: Alert and oriented x3.  ?HEENT: Normocephalic, atraumatic.  ?Skin: Warm and dry. ?Cardiac: Regular rate and rhythm, no murmurs rubs or gallops, no lower extremity edema.  ?Respiratory: Clear to auscultation bilaterally. Not using accessory muscles, speaking in full sentences. ? ?Impression and Recommendations:   ? ?1. Migraine without aura and without status migrainosus, not intractable ?Migraine cocktail given with Toradol 30 mg, Decadron 10 mg, and promethazine 25 mg IM.  Recommend increasing p.o. fluids and rest when she gets home.  Okay to continue as needed use of Excedrin Migraine and Imitrex if indicated.  Patient verbalized understanding is agreeable to the plan. ?- ketorolac (TORADOL) 30 MG/ML injection 30 mg ?- dexamethasone (DECADRON) injection 10 mg ?- promethazine (PHENERGAN) injection 25 mg ? ?Return  if symptoms worsen or fail to improve. ?___________________________________________ ?Thayer Ohm, DNP, APRN, FNP-BC ?Primary Care and Sports Medicine ?[facility] ?

## 2021-12-14 NOTE — Telephone Encounter (Signed)
Patient has called everyday because she has been suffering from migraines. She said she is now at work wearing sunglasses because of her sensitivity to light. She also wanted to make note that she has taken 3 doses of imitrex in the last 24hrs. There are no appts available today and tomorrow for Anita Heath.  ?

## 2022-01-04 ENCOUNTER — Ambulatory Visit: Payer: No Typology Code available for payment source | Admitting: Medical-Surgical

## 2022-01-19 ENCOUNTER — Encounter: Payer: Self-pay | Admitting: Medical-Surgical

## 2022-01-30 NOTE — Progress Notes (Unsigned)
   Established Patient Office Visit  Subjective   Patient ID: Anita Heath, female   DOB: Nov 25, 1980 Age: 41 y.o. MRN: [id number]   No chief complaint on file.   HPI Pleasant 41 year old female presenting today for follow-up on Topamax.  ROS    Objective:    There were no vitals filed for this visit.   Physical Exam   No results found for this or any previous visit (from the past 24 hour(s)).   {Labs (Optional):23779}  The 10-year ASCVD risk score (Arnett DK, et al., 2019) is: 1.5%   Values used to calculate the score:     Age: 89 years     Sex: Female     Is Non-Hispanic African American: No     Diabetic: No     Tobacco smoker: No     Systolic Blood Pressure: [id number] mmHg     Is BP treated: Yes     HDL Cholesterol: 35 mg/dL     Total Cholesterol: 154 mg/dL   Assessment & Plan:   No problem-specific Assessment & Plan notes found for this encounter.   No follow-ups on file.  ___________________________________________ Clearnce Sorrel, DNP, APRN, FNP-BC Primary Care and [place]

## 2022-01-31 ENCOUNTER — Ambulatory Visit: Payer: No Typology Code available for payment source | Admitting: Medical-Surgical

## 2022-01-31 ENCOUNTER — Encounter: Payer: Self-pay | Admitting: Medical-Surgical

## 2022-01-31 VITALS — BP 152/90 | HR 67 | Resp 20 | Ht 64.0 in | Wt 229.4 lb

## 2022-01-31 DIAGNOSIS — I10 Essential (primary) hypertension: Secondary | ICD-10-CM | POA: Diagnosis not present

## 2022-01-31 DIAGNOSIS — R635 Abnormal weight gain: Secondary | ICD-10-CM | POA: Diagnosis not present

## 2022-01-31 DIAGNOSIS — G43009 Migraine without aura, not intractable, without status migrainosus: Secondary | ICD-10-CM

## 2022-01-31 MED ORDER — TOPIRAMATE 50 MG PO TABS: ORAL_TABLET | ORAL | 1 refills | Status: DC

## 2022-01-31 MED ORDER — LISINOPRIL 30 MG PO TABS: 30.0000 mg | ORAL_TABLET | Freq: Every day | ORAL | 1 refills | Status: DC

## 2022-01-31 NOTE — Patient Instructions (Addendum)
Macro diet recipes Reverse dieting  

## 2022-02-01 LAB — CBC WITH DIFFERENTIAL/PLATELET
Absolute Monocytes: 355 cells/uL (ref 200–950)
Basophils Absolute: 43 cells/uL (ref 0–200)
Basophils Relative: 0.6 %
Eosinophils Absolute: 78 cells/uL (ref 15–500)
Eosinophils Relative: 1.1 %
HCT: 42.1 % (ref 35.0–45.0)
Hemoglobin: 13.9 g/dL (ref 11.7–15.5)
Lymphs Abs: 2350 cells/uL (ref 850–3900)
MCH: 28.7 pg (ref 27.0–33.0)
MCHC: 33 g/dL (ref 32.0–36.0)
MCV: 87 fL (ref 80.0–100.0)
MPV: 9.7 fL (ref 7.5–12.5)
Monocytes Relative: 5 %
Neutro Abs: 4274 cells/uL (ref 1500–7800)
Neutrophils Relative %: 60.2 %
Platelets: 336 10*3/uL (ref 140–400)
RBC: 4.84 10*6/uL (ref 3.80–5.10)
RDW: 12.4 % (ref 11.0–15.0)
Total Lymphocyte: 33.1 %
WBC: 7.1 10*3/uL (ref 3.8–10.8)

## 2022-02-01 LAB — COMPLETE METABOLIC PANEL WITH GFR
AG Ratio: 1.4 (calc) (ref 1.0–2.5)
ALT: 33 U/L — ABNORMAL HIGH (ref 6–29)
AST: 25 U/L (ref 10–30)
Albumin: 4.1 g/dL (ref 3.6–5.1)
Alkaline phosphatase (APISO): 84 U/L (ref 31–125)
BUN: 12 mg/dL (ref 7–25)
CO2: 30 mmol/L (ref 20–32)
Calcium: 9.2 mg/dL (ref 8.6–10.2)
Chloride: 102 mmol/L (ref 98–110)
Creat: 0.61 mg/dL (ref 0.50–0.99)
Globulin: 2.9 g/dL (calc) (ref 1.9–3.7)
Glucose, Bld: 100 mg/dL — ABNORMAL HIGH (ref 65–99)
Potassium: 4 mmol/L (ref 3.5–5.3)
Sodium: 139 mmol/L (ref 135–146)
Total Bilirubin: 0.6 mg/dL (ref 0.2–1.2)
Total Protein: 7 g/dL (ref 6.1–8.1)
eGFR: 114 mL/min/{1.73_m2} (ref >=60)

## 2022-02-01 LAB — HEMOGLOBIN A1C
Hgb A1c MFr Bld: 5.5 % of total Hgb (ref <5.7)
Mean Plasma Glucose: 111 mg/dL
eAG (mmol/L): 6.2 mmol/L

## 2022-02-01 LAB — LIPID PANEL
Cholesterol: 151 mg/dL (ref <200)
HDL: 35 mg/dL — ABNORMAL LOW (ref >=50)
LDL Cholesterol (Calc): 93 mg/dL (calc)
Non-HDL Cholesterol (Calc): 116 mg/dL (calc) (ref <130)
Total CHOL/HDL Ratio: 4.3 (calc) (ref <5.0)
Triglycerides: 133 mg/dL (ref <150)

## 2022-02-01 LAB — TSH: TSH: 1.67 mIU/L

## 2022-02-08 ENCOUNTER — Encounter: Payer: Self-pay | Admitting: Medical-Surgical

## 2022-02-12 MED ORDER — PANTOPRAZOLE SODIUM 40 MG PO TBEC: 40.0000 mg | DELAYED_RELEASE_TABLET | Freq: Every day | ORAL | 3 refills | Status: DC

## 2022-02-14 ENCOUNTER — Ambulatory Visit: Payer: No Typology Code available for payment source

## 2022-02-16 ENCOUNTER — Ambulatory Visit: Payer: No Typology Code available for payment source

## 2022-02-22 ENCOUNTER — Ambulatory Visit: Payer: No Typology Code available for payment source

## 2022-03-23 ENCOUNTER — Other Ambulatory Visit: Payer: Self-pay | Admitting: Medical-Surgical

## 2022-03-23 DIAGNOSIS — G43009 Migraine without aura, not intractable, without status migrainosus: Secondary | ICD-10-CM

## 2022-03-27 ENCOUNTER — Encounter: Payer: Self-pay | Admitting: Family Medicine

## 2022-03-27 ENCOUNTER — Ambulatory Visit (INDEPENDENT_AMBULATORY_CARE_PROVIDER_SITE_OTHER): Payer: Managed Care, Other (non HMO) | Admitting: Family Medicine

## 2022-03-27 VITALS — BP 145/74 | HR 71 | Ht 64.0 in | Wt 231.0 lb

## 2022-03-27 DIAGNOSIS — R34 Anuria and oliguria: Secondary | ICD-10-CM

## 2022-03-27 DIAGNOSIS — G43019 Migraine without aura, intractable, without status migrainosus: Secondary | ICD-10-CM

## 2022-03-27 DIAGNOSIS — R600 Localized edema: Secondary | ICD-10-CM

## 2022-03-27 DIAGNOSIS — R252 Cramp and spasm: Secondary | ICD-10-CM

## 2022-03-27 DIAGNOSIS — G43909 Migraine, unspecified, not intractable, without status migrainosus: Secondary | ICD-10-CM | POA: Insufficient documentation

## 2022-03-27 DIAGNOSIS — R631 Polydipsia: Secondary | ICD-10-CM | POA: Diagnosis not present

## 2022-03-27 LAB — POCT URINALYSIS DIP (CLINITEK)
Bilirubin, UA: NEGATIVE
Blood, UA: NEGATIVE
Glucose, UA: NEGATIVE mg/dL
Ketones, POC UA: NEGATIVE mg/dL
Leukocytes, UA: NEGATIVE
Nitrite, UA: NEGATIVE
POC PROTEIN,UA: NEGATIVE
Spec Grav, UA: 1.01 (ref 1.010–1.025)
Urobilinogen, UA: 0.2 E.U./dL
pH, UA: 7 (ref 5.0–8.0)

## 2022-03-27 NOTE — Progress Notes (Signed)
Established Patient Office Visit  Subjective   Patient ID: Anita Heath, female    DOB: 17-Feb-1981  Age: 41 y.o. MRN: [id number]  Chief Complaint  Patient presents with   Edema    Pt c/o bilateral ankle swelling and bilateral calf swelling  onset for 2 months, pt states the change in her htn medication makes her feel thirst.    HPI  Pt c/o bilateral ankle swelling and bilateral calf swelling  onset for 2 months, pt states the change in her htn medication makes her feel thirst.  Her lisinopril was  increased from 20 mg to 30 mg..  Though she had been on the 20 mg for quite a long time.  Pantoprazole was started around that time 40 mg as well.  She has had diarrhea with Prevacid in the past but this was a new medication she had not tried before.  She also restarted her Topamax but had also been on that previously without any significant problems.  She feels like she is not urinating nearly as much even though she feels extremely thirsty and feels like she is drinking more and over the weekend both ankles were swelling.  Is a little bit worse on the right compared to the left and the last couple days she has not been able to wear her rings so she has been getting swelling in her hands as well.  She is also been experiencing cramping almost daily often times at night in her lower legs this is also been going on for the last couple months since the adjustment in her medications.  She has also had a persistent daily headache for the last 6 days.  It is not quite to the intensity level of a migraine she does have a history of migraines and recently restarted the Topamax but it has been persistent.  He denies any chest pain or diarrhea.    ROS    Objective:     BP (!) 145/74   Pulse 71   Ht 5\' 4"  (1.626 m)   Wt 231 lb (104.8 kg)   SpO2 99%   BMI 39.65 kg/m    Physical Exam Vitals and nursing note reviewed.  Constitutional:      Appearance: She is well-developed.  HENT:     Head:  Normocephalic and atraumatic.  Cardiovascular:     Rate and Rhythm: Normal rate and regular rhythm.     Heart sounds: Normal heart sounds.  Pulmonary:     Effort: Pulmonary effort is normal.     Breath sounds: Normal breath sounds.  Skin:    General: Skin is warm and dry.  Neurological:     Mental Status: She is alert and oriented to person, place, and time.  Psychiatric:        Behavior: Behavior normal.      Results for orders placed or performed in visit on 03/27/22  POCT URINALYSIS DIP (CLINITEK)  Result Value Ref Range   Color, UA yellow yellow   Clarity, UA clear clear   Glucose, UA negative negative mg/dL   Bilirubin, UA negative negative   Ketones, POC UA negative negative mg/dL   Spec Grav, UA 05/27/22 7.616 - 1.025   Blood, UA negative negative   pH, UA 7.0 5.0 - 8.0   POC PROTEIN,UA negative negative, trace   Urobilinogen, UA 0.2 0.2 or 1.0 E.U./dL   Nitrite, UA Negative Negative   Leukocytes, UA Negative Negative      The 10-year ASCVD  risk score (Arnett DK, et al., 2019) is: 1.6%    Assessment & Plan:   Problem List Items Addressed This Visit       Cardiovascular and Mediastinum   Migraine headache    persistent HA for 6 days that she does not feel like its migraine level.  If not improving then please let me know.  Clear if related to her other symptoms which have been going on for a couple of months.      Relevant Orders   POCT URINALYSIS DIP (CLINITEK) (Completed)   COMPLETE METABOLIC PANEL WITH GFR   CK (Creatine Kinase)   Osmolality   Other Visit Diagnoses     Lower extremity edema    -  Primary   Relevant Orders   POCT URINALYSIS DIP (CLINITEK) (Completed)   COMPLETE METABOLIC PANEL WITH GFR   CK (Creatine Kinase)   Osmolality   Excessive thirst       Relevant Orders   POCT URINALYSIS DIP (CLINITEK) (Completed)   COMPLETE METABOLIC PANEL WITH GFR   CK (Creatine Kinase)   Osmolality   Decreased urination       Relevant Orders    POCT URINALYSIS DIP (CLINITEK) (Completed)   COMPLETE METABOLIC PANEL WITH GFR   CK (Creatine Kinase)   Osmolality   Muscle cramps          Lower extremity edema with decreased urination and increased thirst-unclear etiology she just had her thyroid checked back in June and it looked great so nice of thyroid disease.  Will check potassium and renal function.  Urinalysis was normal no sign of glucose urea or protein loss.  Will get a serum osmolality unable to get a urine osmolality because she was just give a urine sample here.  Leg cramps-we will check CK and electrolytes.  No follow-ups on file.    Nani Gasser, MD

## 2022-03-27 NOTE — Patient Instructions (Signed)
HOld the pantoprazole for a week and see if you feel better.

## 2022-03-27 NOTE — Assessment & Plan Note (Signed)
persistent HA for 6 days that she does not feel like its migraine level.  If not improving then please let me know.  Clear if related to her other symptoms which have been going on for a couple of months.

## 2022-03-28 LAB — COMPLETE METABOLIC PANEL WITHOUT GFR
AG Ratio: 1.5 (calc) (ref 1.0–2.5)
ALT: 26 U/L (ref 6–29)
AST: 20 U/L (ref 10–30)
Albumin: 4.1 g/dL (ref 3.6–5.1)
Alkaline phosphatase (APISO): 81 U/L (ref 31–125)
BUN: 9 mg/dL (ref 7–25)
CO2: 27 mmol/L (ref 20–32)
Calcium: 9.2 mg/dL (ref 8.6–10.2)
Chloride: 104 mmol/L (ref 98–110)
Creat: 0.79 mg/dL (ref 0.50–0.99)
Globulin: 2.7 g/dL (ref 1.9–3.7)
Glucose, Bld: 96 mg/dL (ref 65–99)
Potassium: 3.9 mmol/L (ref 3.5–5.3)
Sodium: 138 mmol/L (ref 135–146)
Total Bilirubin: 0.5 mg/dL (ref 0.2–1.2)
Total Protein: 6.8 g/dL (ref 6.1–8.1)
eGFR: 95 mL/min/1.73m2

## 2022-03-28 LAB — OSMOLALITY: Osmolality: 284 mOsm/kg (ref 278–305)

## 2022-03-28 LAB — CK: Total CK: 158 U/L — ABNORMAL HIGH (ref 29–143)

## 2022-03-29 ENCOUNTER — Encounter: Payer: Self-pay | Admitting: Family Medicine

## 2022-03-29 DIAGNOSIS — I10 Essential (primary) hypertension: Secondary | ICD-10-CM

## 2022-03-29 MED ORDER — HYDROCHLOROTHIAZIDE 12.5 MG PO CAPS: 12.5000 mg | ORAL_CAPSULE | Freq: Every day | ORAL | 1 refills | Status: DC

## 2022-03-29 NOTE — Telephone Encounter (Signed)
Meds ordered this encounter  ?Medications  ? hydrochlorothiazide (MICROZIDE) 12.5 MG capsule  ?  Sig: Take 1 capsule (12.5 mg total) by mouth daily.  ?  Dispense:  30 capsule  ?  Refill:  1  ? ? ?

## 2022-03-29 NOTE — Progress Notes (Signed)
Hi Anita Heath, your metabolic panel looks good so no electrolyte abnormality.  The kidneys and liver look good which is reassuring.  Serum osmolality looks good.  Muscle enzyme was just slightly elevated.  Not in a worrisome range for certain disorders that cause excess muscle breakdown so that is reassuring.  Now that I know you are kidney and potassium levels are normal I would like to add a low-dose of HCTZ to help bring down your blood pressure and will help pull some of the fluid off and see if you feel better as well as holding your newest medication just to see if that is helpful.  If you are okay with this then please let me know

## 2022-04-02 NOTE — Progress Notes (Unsigned)
Virtual Visit via Video Note  I connected with Anita Heath on 04/03/22 at 11:10 AM EDT by a video enabled telemedicine application and verified that I am speaking with the correct person using two identifiers.   I discussed the limitations of evaluation and management by telemedicine and the availability of in person appointments. The patient expressed understanding and agreed to proceed.  Patient location: home Provider locations: office  Subjective:    CC: Blood pressure follow-up  HPI: Pleasant 41 year old female presenting via MyChart video visit for follow-up on blood pressure.  She was evaluated by Dr. Linford Arnold on 8/8 for concerns with lower extremity edema, headache, excessive thirst, and decreased urination.  She had a full work-up for labs that were unremarkable.  With normal kidney function and potassium levels, she was started on a low-dose of hydrochlorothiazide to see if this helped with the swelling as well as her other symptoms. Since then, she reports doing well on the HCTZ 12.5mg  daily. She has continued Lisinopril 30mg  daily. Monitoring BP with home readings in 120s/70s. Checked her BP right before our appointment with reading of 125/77.  She has had full resolution of lower extremity edema and other previous symptoms.  Notes that she stopped taking pantoprazole which she feels was the cause of her ankle swelling, elevated blood pressure, and generalized malaise.  She has returned to taking Prilosec over-the-counter on an as-needed basis.  This helps as long as she stays away from certain known triggers.   Notes that she has had an increase in her migraine frequency.  She is currently taking Topamax 50 mg once daily and wonders if she can safely increase this to twice daily.  Requesting a refill on sumatriptan.  Past medical history, Surgical history, Family history not pertinant except as noted below, Social history, Allergies, and medications have been entered into the medical  record, reviewed, and corrections made.   Review of Systems: See HPI for pertinent positives and negatives.   Objective:    General: Speaking clearly in complete sentences without any shortness of breath.  Alert and oriented x3.  Normal judgment. No apparent acute distress.  Impression and Recommendations:    1. Hypertension, unspecified type Blood pressure at goal today.  Continue lisinopril 30 mg daily and hydrochlorothiazide 12.5 mg daily as prescribed.  Refill sent to pharmacy.  Monitor blood pressure at home with a goal of 130/80 or less.  If consistently higher, return for medication adjustment.  Low-sodium diet, regular intentional exercise, and weight loss to healthy weight recommended. - hydrochlorothiazide (MICROZIDE) 12.5 MG capsule; Take 1 capsule (12.5 mg total) by mouth daily.  Dispense: 90 capsule; Refill: 1  2. Migraine without aura and without status migrainosus, not intractable Continue as needed Imitrex.  Increasing Topamax to 50 mg twice daily. - SUMAtriptan (IMITREX) 100 MG tablet; Take 1 tablet (100 mg total) by mouth once for 1 dose. May repeat in 2 hours if headache persists or recurs.  Dispense: 10 tablet; Refill: 0  3. Gastroesophageal reflux disease without esophagitis Continue over-the-counter Prilosec.  Pantoprazole added as an intolerance.  I discussed the assessment and treatment plan with the patient. The patient was provided an opportunity to ask questions and all were answered. The patient agreed with the plan and demonstrated an understanding of the instructions.   The patient was advised to call back or seek an in-person evaluation if the symptoms worsen or if the condition fails to improve as anticipated.  25 minutes of non-face-to-face time was provided during  this encounter.  Return in about 3 months (around 07/04/2022) for HTN/migraine follow up.  Thayer Ohm, DNP, APRN, FNP-BC [facility] and Sports  Medicine

## 2022-04-03 ENCOUNTER — Telehealth (INDEPENDENT_AMBULATORY_CARE_PROVIDER_SITE_OTHER): Payer: Managed Care, Other (non HMO) | Admitting: Medical-Surgical

## 2022-04-03 ENCOUNTER — Encounter: Payer: Self-pay | Admitting: Medical-Surgical

## 2022-04-03 DIAGNOSIS — K219 Gastro-esophageal reflux disease without esophagitis: Secondary | ICD-10-CM

## 2022-04-03 DIAGNOSIS — I10 Essential (primary) hypertension: Secondary | ICD-10-CM

## 2022-04-03 DIAGNOSIS — G43009 Migraine without aura, not intractable, without status migrainosus: Secondary | ICD-10-CM

## 2022-04-03 MED ORDER — SUMATRIPTAN SUCCINATE 100 MG PO TABS: 100.0000 mg | ORAL_TABLET | Freq: Once | ORAL | 0 refills | Status: DC

## 2022-04-03 MED ORDER — TOPIRAMATE 50 MG PO TABS: 50.0000 mg | ORAL_TABLET | Freq: Two times a day (BID) | ORAL | 1 refills | Status: DC

## 2022-04-03 MED ORDER — HYDROCHLOROTHIAZIDE 12.5 MG PO CAPS: 12.5000 mg | ORAL_CAPSULE | Freq: Every day | ORAL | 1 refills | Status: DC

## 2022-04-08 ENCOUNTER — Encounter: Payer: Self-pay | Admitting: Medical-Surgical

## 2022-04-18 ENCOUNTER — Ambulatory Visit: Payer: Managed Care, Other (non HMO) | Admitting: Medical-Surgical

## 2022-04-29 NOTE — Progress Notes (Unsigned)
   Established Patient Office Visit  Subjective   Patient ID: Fanny Agan, female   DOB: Jun 15, 1981 Age: 41 y.o. MRN: [id number]   No chief complaint on file.   HPI Pleasant 41 year old female presenting today for discussion of weight concerns.    Objective:    There were no vitals filed for this visit.  Physical Exam   No results found for this or any previous visit (from the past 24 hour(s)).   {Labs (Optional):23779}  The 10-year ASCVD risk score (Arnett DK, et al., 2019) is: 1.6%   Values used to calculate the score:     Age: 6 years     Sex: Female     Is Non-Hispanic African American: No     Diabetic: No     Tobacco smoker: No     Systolic Blood Pressure: 145 mmHg     Is BP treated: Yes     HDL Cholesterol: 35 mg/dL     Total Cholesterol: 151 mg/dL   Assessment & Plan:   No problem-specific Assessment & Plan notes found for this encounter.   No follow-ups on file.  ___________________________________________ Thayer Ohm, DNP, APRN, FNP-BC Primary Care and Sports Medicine [facility] [place]

## 2022-04-30 ENCOUNTER — Other Ambulatory Visit: Payer: Self-pay | Admitting: Medical-Surgical

## 2022-04-30 ENCOUNTER — Encounter: Payer: Managed Care, Other (non HMO) | Admitting: Medical-Surgical

## 2022-04-30 DIAGNOSIS — E661 Drug-induced obesity: Secondary | ICD-10-CM

## 2022-04-30 DIAGNOSIS — E782 Mixed hyperlipidemia: Secondary | ICD-10-CM

## 2022-05-16 ENCOUNTER — Ambulatory Visit: Payer: Managed Care, Other (non HMO) | Admitting: Medical-Surgical

## 2022-05-16 ENCOUNTER — Encounter: Payer: Self-pay | Admitting: Medical-Surgical

## 2022-05-16 VITALS — BP 121/72 | HR 72 | Resp 20 | Ht 64.0 in | Wt 216.5 lb

## 2022-05-16 DIAGNOSIS — G43009 Migraine without aura, not intractable, without status migrainosus: Secondary | ICD-10-CM

## 2022-05-16 DIAGNOSIS — E661 Drug-induced obesity: Secondary | ICD-10-CM | POA: Diagnosis not present

## 2022-05-16 DIAGNOSIS — F418 Other specified anxiety disorders: Secondary | ICD-10-CM

## 2022-05-16 DIAGNOSIS — Z2821 Immunization not carried out because of patient refusal: Secondary | ICD-10-CM

## 2022-05-16 DIAGNOSIS — I10 Essential (primary) hypertension: Secondary | ICD-10-CM

## 2022-05-16 DIAGNOSIS — Z6837 Body mass index (BMI) 37.0-37.9, adult: Secondary | ICD-10-CM

## 2022-05-16 MED ORDER — TOPIRAMATE 50 MG PO TABS: 50.0000 mg | ORAL_TABLET | Freq: Three times a day (TID) | ORAL | 1 refills | Status: DC

## 2022-05-16 MED ORDER — PHENTERMINE HCL 37.5 MG PO CAPS: 37.5000 mg | ORAL_CAPSULE | ORAL | 0 refills | Status: DC

## 2022-05-16 NOTE — Progress Notes (Signed)
Established Patient Office Visit  Subjective   Patient ID: Anita Heath, female   DOB: 1980/12/26 Age: 41 y.o. MRN: [id number]   Chief Complaint  Patient presents with   Hypertension   Migraine    HPI Pleasant 41 year old female presenting today for the following:  Hypertension: Taking hydrochlorothiazide 12.5 mg, lisinopril 30 mg, and propranolol ER 60 mg daily as prescribed, tolerating well without side effects.  Checking blood pressures at home and notes her readings have been in the 120s over 70s similar to today's readings.  Following a low-sodium diet and exercising regularly. Denies CP, SOB, palpitations, lower extremity edema, dizziness, headaches, or vision changes.  Migraine: taking Topamax 100mg  daily, tolerating well without side effects. Was prescribed as 50mg  twice daily but just takes both in the  morning. This has helped greatly with her migraines but she still has about 2 migraines per month. Uses Imitrex prn and it helps with quick resolution of her symptoms. Is interested in possibly increasing the Topamax to see if it will prevent the migraines completely.   Weight concerns: Has started exercising 4-5 times weekly doing cardio and some weights. Goes with her friend to help keep them accountable. Doing a low carb diet for the past 2 months and feels that it is working very well with her. She feels like the diet provides her the opportunity to eat a lot of foods she likes and she has been very strict on avoiding carbs to prevent "falling off the wagon". She is interested in restarting phentermine to help boost her weight loss. Her goal for now is 150lbs. She has lost 15lbs in the last 2 months.   Mood: reports that she and her husband of 20 years have separated. Since then, she has been doing well and feels much more relaxed and happy. Taking Fluoxetine 40mg  daily, tolerating well without side effects.    Objective:    Vitals:   05/16/22 1035  BP: 121/72  Pulse: 72   Resp: 20  Height: 5\' 4"  (1.626 m)  Weight: 216 lb 8 oz (98.2 kg)  SpO2: 98%  BMI (Calculated): 37.14    Physical Exam Vitals and nursing note reviewed.  Constitutional:      General: She is not in acute distress.    Appearance: Normal appearance. She is not ill-appearing.  HENT:     Head: Normocephalic and atraumatic.  Cardiovascular:     Rate and Rhythm: Normal rate and regular rhythm.     Pulses: Normal pulses.     Heart sounds: Normal heart sounds.  Pulmonary:     Effort: Pulmonary effort is normal. No respiratory distress.     Breath sounds: Normal breath sounds. No wheezing, rhonchi or rales.  Skin:    General: Skin is warm and dry.  Neurological:     Mental Status: She is alert and oriented to person, place, and time.  Psychiatric:        Mood and Affect: Mood normal.        Behavior: Behavior normal.        Thought Content: Thought content normal.        Judgment: Judgment normal.   No results found for this or any previous visit (from the past 24 hour(s)).     The 10-year ASCVD risk score (Arnett DK, et al., 2019) is: 1.1%   Values used to calculate the score:     Age: 15 years     Sex: Female     Is Non-Hispanic  African American: No     Diabetic: No     Tobacco smoker: No     Systolic Blood Pressure: 209 mmHg     Is BP treated: Yes     HDL Cholesterol: 35 mg/dL     Total Cholesterol: 151 mg/dL   Assessment & Plan:   1. Primary hypertension BP stable and at goal. Continue HCTZ, Lisinopril, and Propranolol as prescribed. Monitor BP at home with a goal of 130/80 or less. Continue regular intentional exercise. Recommend low sodium diet.   2. Migraine without aura and without status migrainosus, not intractable Increase Topamax to 100mg  every morning with a 50mg  evening dose. Continue Imitrex prn for breakthrough migraines.   3. Class 2 drug-induced obesity without serious comorbidity with body mass index (BMI) of 37.0 to 37.9 in adult Discussed her  current weight loss efforts. She has already lost 15lbs with her diet and exercise habits. Some concern for phentermine raising BP but also that the "boost" that she gets from the medication may end with regain of some of her weight after stopping the medication. Patient verbalized understanding and would still prefer to try the phentermine for at least a month to see if it may help. Restart phentermine 37.5 mg daily. Monitor BP closely and if it rises greater than the above goal, return for further evaluation and  medication management.   4. Anxiety with depression Stable. Continue Fluoxetine 40mg  daily.   5. Influenza vaccination declined Patient aware of risks vs benefits of vaccination. Declined today.   Return in about 4 weeks (around 06/13/2022) for weight check.  ___________________________________________ Clearnce Sorrel, DNP, APRN, FNP-BC [facility] and [place]

## 2022-06-14 ENCOUNTER — Ambulatory Visit: Payer: Managed Care, Other (non HMO) | Admitting: Medical-Surgical

## 2022-06-20 ENCOUNTER — Other Ambulatory Visit: Payer: Self-pay | Admitting: Medical-Surgical

## 2022-06-20 DIAGNOSIS — G43009 Migraine without aura, not intractable, without status migrainosus: Secondary | ICD-10-CM

## 2022-06-30 ENCOUNTER — Other Ambulatory Visit: Payer: Self-pay | Admitting: Medical-Surgical

## 2022-06-30 DIAGNOSIS — F418 Other specified anxiety disorders: Secondary | ICD-10-CM

## 2022-07-02 ENCOUNTER — Ambulatory Visit: Payer: Managed Care, Other (non HMO) | Admitting: Medical-Surgical

## 2022-08-10 ENCOUNTER — Other Ambulatory Visit: Payer: Self-pay | Admitting: Medical-Surgical

## 2022-08-10 DIAGNOSIS — I10 Essential (primary) hypertension: Secondary | ICD-10-CM

## 2022-08-10 DIAGNOSIS — E782 Mixed hyperlipidemia: Secondary | ICD-10-CM

## 2022-08-16 ENCOUNTER — Telehealth (INDEPENDENT_AMBULATORY_CARE_PROVIDER_SITE_OTHER): Payer: Managed Care, Other (non HMO) | Admitting: Medical-Surgical

## 2022-08-16 ENCOUNTER — Encounter: Payer: Self-pay | Admitting: Medical-Surgical

## 2022-08-16 DIAGNOSIS — F418 Other specified anxiety disorders: Secondary | ICD-10-CM | POA: Diagnosis not present

## 2022-08-16 MED ORDER — ARIPIPRAZOLE 2 MG PO TABS: 2.0000 mg | ORAL_TABLET | Freq: Every day | ORAL | 1 refills | Status: DC

## 2022-08-16 NOTE — Progress Notes (Signed)
Virtual Visit via Video Note  I connected with Anita Heath on 08/16/22 at 11:10 AM EST by a video enabled telemedicine application and verified that I am speaking with the correct person using two identifiers.   I discussed the limitations of evaluation and management by telemedicine and the availability of in person appointments. The patient expressed understanding and agreed to proceed.  Patient location: home Provider locations: office  Subjective:    CC: mood follow up  HPI: Pleasant 41 year old female presenting via MyChart video visit to discuss recent concerns with anxiety and depression. Taking Fluoxetine 40mg  daily, tolerating well without side effects. Has been on this for several years. Previously tried Lexapro and Wellbutrin. Reports having a lot of mood swings over the past several weeks. Some days she is  happy and others she is sad and crying. No recent changes outside of separating from her husband in September but feels she is doing well handling that situation. Not doing any counseling. Having trouble sleeping at times.   Past medical history, Surgical history, Family history not pertinant except as noted below, Social history, Allergies, and medications have been entered into the medical record, reviewed, and corrections made.   Review of Systems: See HPI for pertinent positives and negatives.   Objective:    General: Speaking clearly in complete sentences without any shortness of breath.  Alert and oriented x3.  Normal judgment. No apparent acute distress.  Impression and Recommendations:    1. Anxiety with depression Continue Fluoxetine 40mg  daily. Adding Abilify 2mg  daily for mood stabilization. Recommend counseling.   I discussed the assessment and treatment plan with the patient. The patient was provided an opportunity to ask questions and all were answered. The patient agreed with the plan and demonstrated an understanding of the instructions.   The patient  was advised to call back or seek an in-person evaluation if the symptoms worsen or if the condition fails to improve as anticipated.  20 minutes of non-face-to-face time was provided during this encounter.  Return in about 4 weeks (around 09/13/2022) for mood follow up.  , DNP, APRN, FNP-BC [facility] and [facility] Medicine

## 2022-08-28 IMAGING — US US RENAL
1 series · 14 of 25 positions shown · non-contrast
Comparison: None.

CLINICAL DATA: Flank pain with nausea, urinary hesitance and
abdominal tenderness.

EXAM:
RENAL / URINARY TRACT ULTRASOUND COMPLETE

[Series 1: us renal · 0.28mm/px · 14 of 27 slices shown]
[im 1/27]
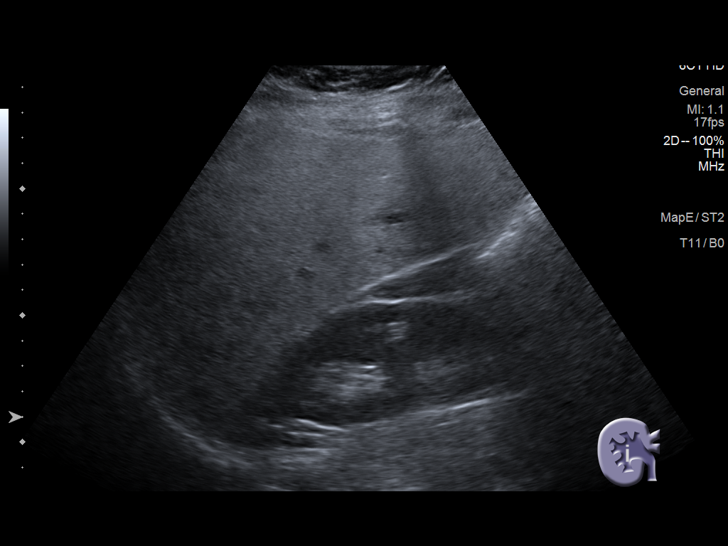
[im 3/27]
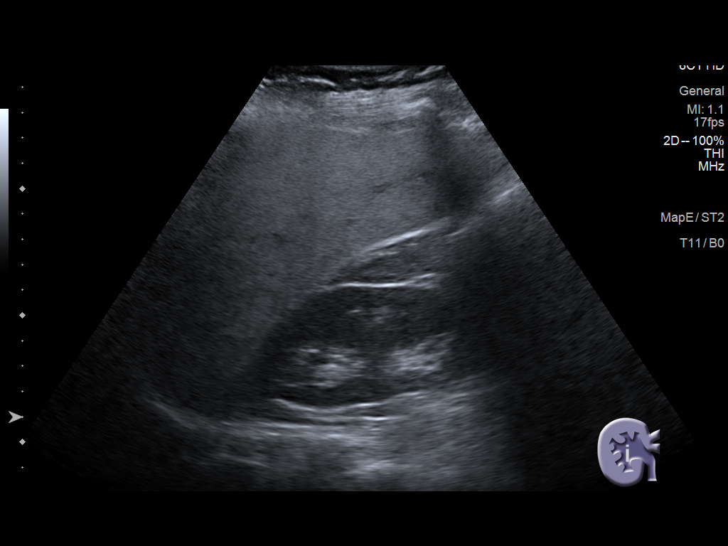
[im 5/27]
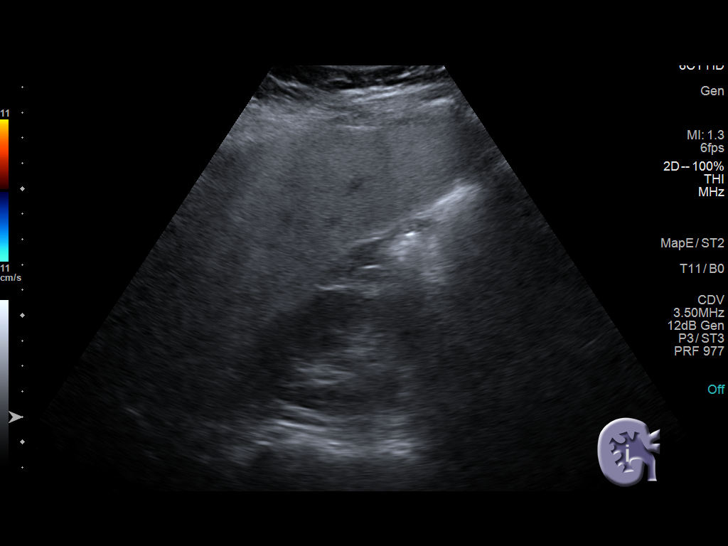
[im 7/27]
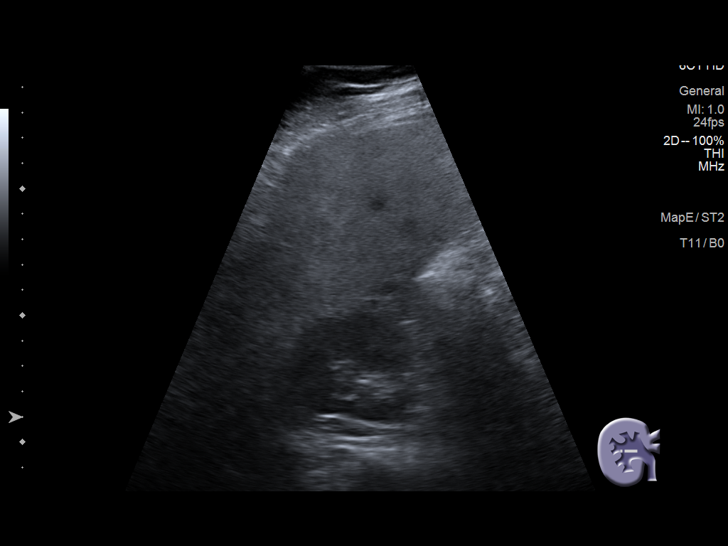
[im 9/27]
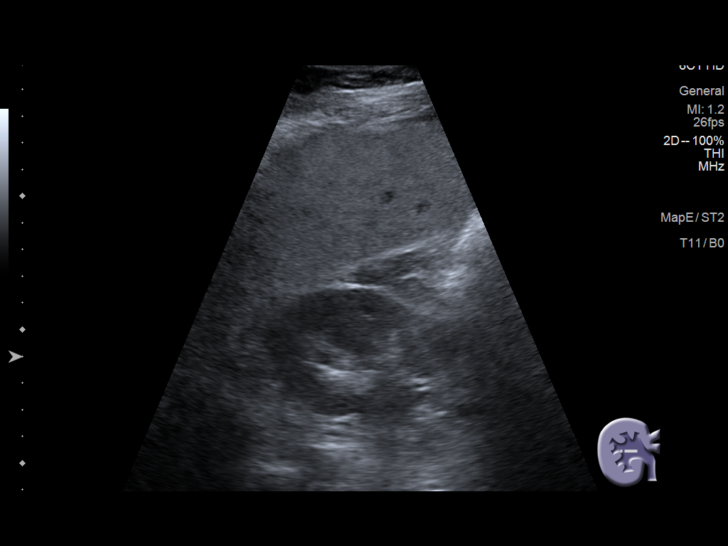
[im 10/27]
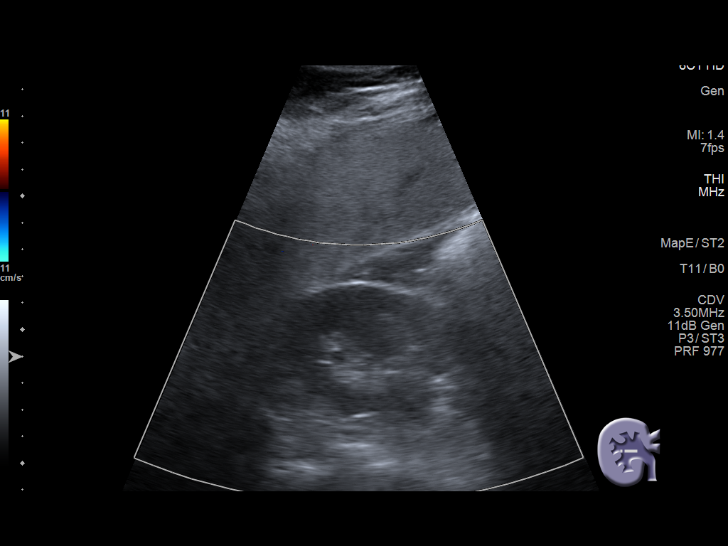
[im 12/27]
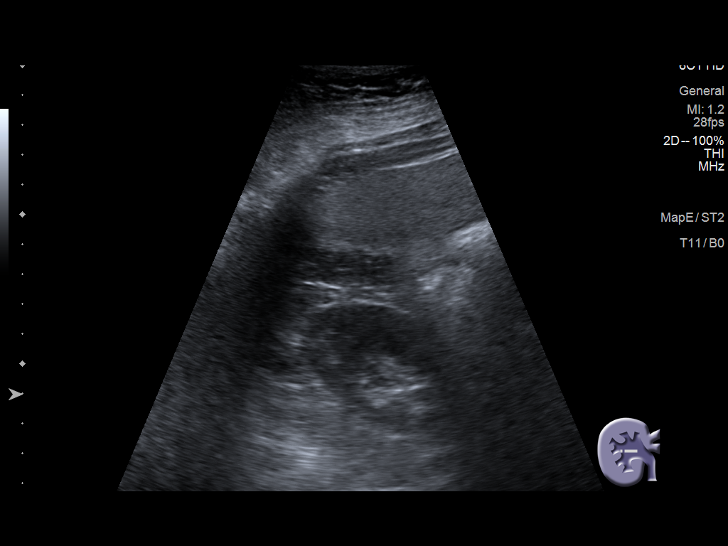
[im 15/27]
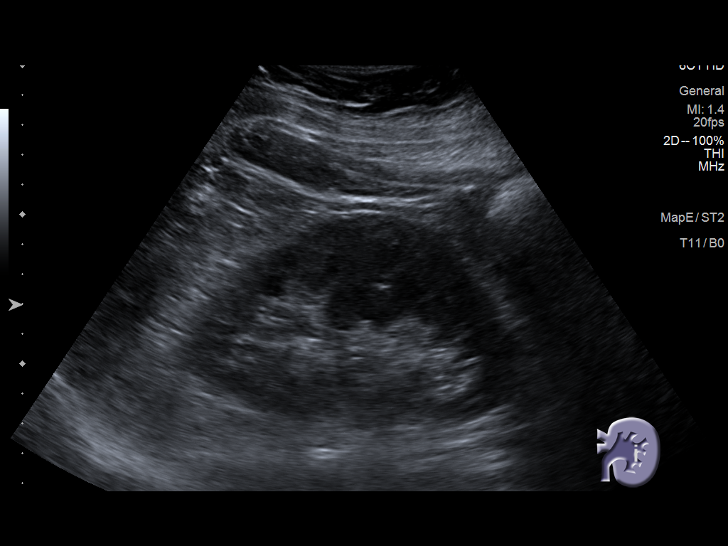
[im 17/27]
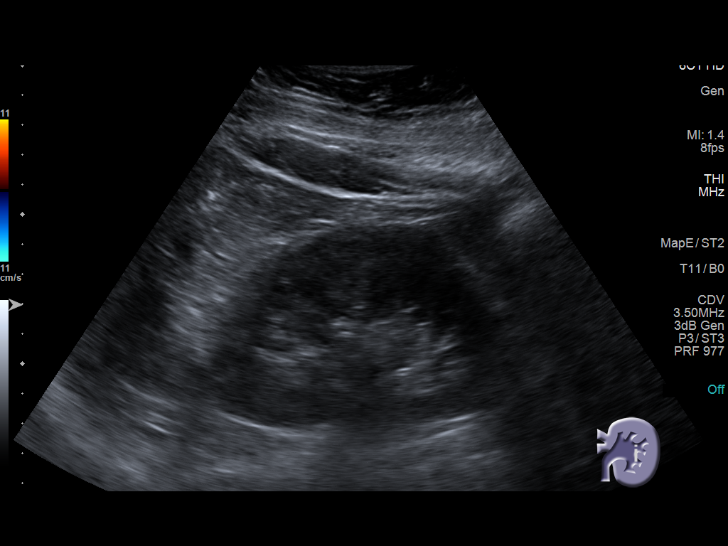
[im 18/27]
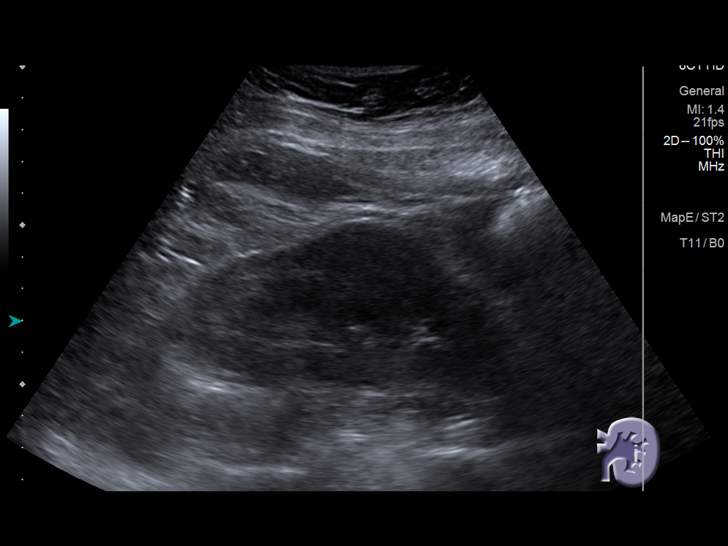
[im 20/27]
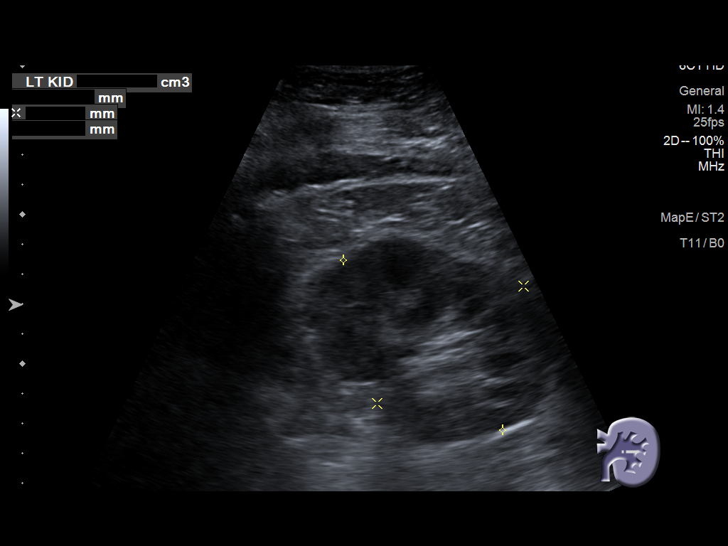
[im 22/27]
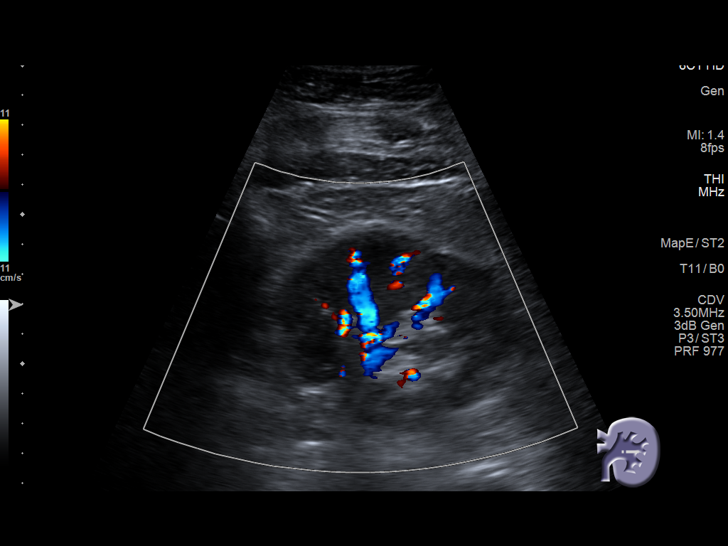
[im 24/27]
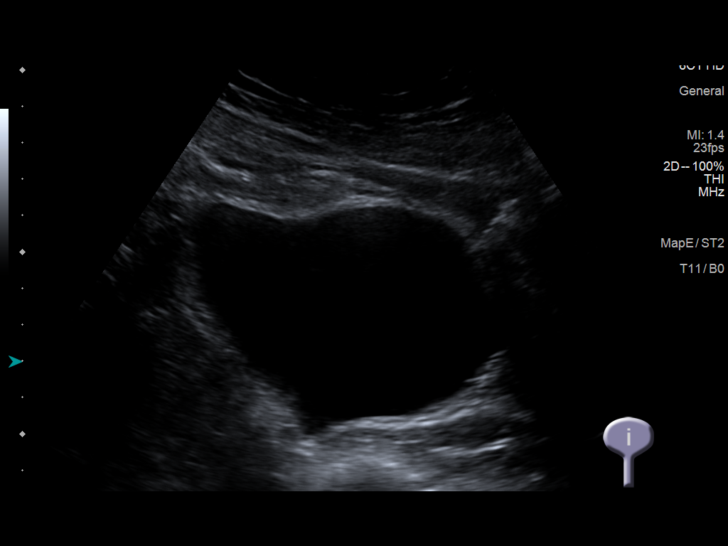
[im 27/27]
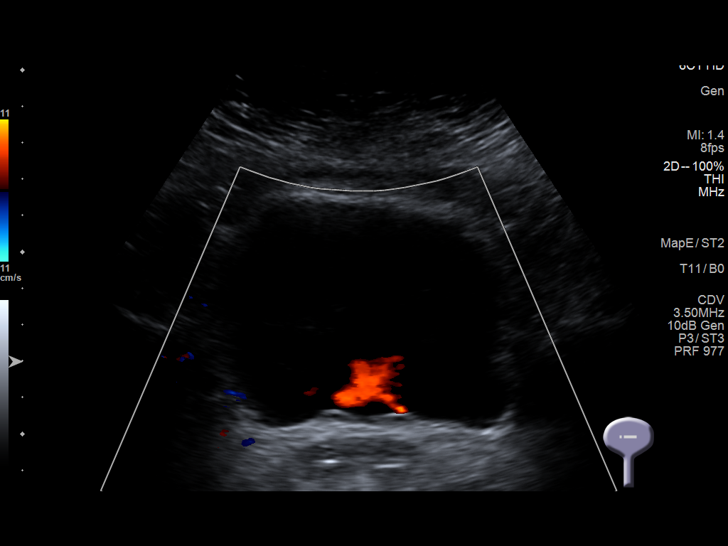

[14 of 25 positions shown; findings below may reference images not displayed]

FINDINGS: Right Kidney:

Renal measurements: 9.8 x 5.4 x 5.0 cm = volume: 138 mL.
Echogenicity within normal limits. No mass or hydronephrosis
visualized.

Left Kidney:

Renal measurements: 11.3 x 6.3 x 7.8 cm = volume: 290 mL.
Echogenicity within normal limits. No mass or hydronephrosis
visualized.

Bladder:

Appears normal for degree of bladder distention.

Other:

Liver demonstrates significant increased parenchymal echogenicity
consistent with hepatic steatosis.
IMPRESSION: 1. Normal renal ultrasound.
2. Incidental note made of hepatic steatosis.

## 2022-09-10 ENCOUNTER — Encounter: Payer: Self-pay | Admitting: Medical-Surgical

## 2022-09-11 ENCOUNTER — Other Ambulatory Visit: Payer: Self-pay

## 2022-09-11 MED ORDER — TOPIRAMATE 50 MG PO TABS: ORAL_TABLET | ORAL | 1 refills | Status: DC

## 2022-09-11 NOTE — Telephone Encounter (Signed)
Patient requesting rx rf of topamax Last written 05/16/2022 Last OV  (video) 04/03/2022 In chart states topamax was increased to 2 times daily  But medication in patient chart reads 3 times daily  Sent for your review.  Thank you

## 2022-10-01 ENCOUNTER — Other Ambulatory Visit: Payer: Self-pay | Admitting: Medical-Surgical

## 2022-10-01 DIAGNOSIS — F418 Other specified anxiety disorders: Secondary | ICD-10-CM

## 2022-10-03 LAB — HEMOGLOBIN A1C: Hemoglobin A1C: 5.8

## 2022-10-22 ENCOUNTER — Encounter: Payer: Self-pay | Admitting: Medical-Surgical

## 2022-10-22 ENCOUNTER — Ambulatory Visit (INDEPENDENT_AMBULATORY_CARE_PROVIDER_SITE_OTHER): Payer: Managed Care, Other (non HMO) | Admitting: Medical-Surgical

## 2022-10-22 VITALS — BP 109/66 | HR 70 | Resp 20 | Ht 64.0 in | Wt 214.6 lb

## 2022-10-22 DIAGNOSIS — I10 Essential (primary) hypertension: Secondary | ICD-10-CM | POA: Diagnosis not present

## 2022-10-22 DIAGNOSIS — G43009 Migraine without aura, not intractable, without status migrainosus: Secondary | ICD-10-CM | POA: Diagnosis not present

## 2022-10-22 DIAGNOSIS — Z0000 Encounter for general adult medical examination without abnormal findings: Secondary | ICD-10-CM | POA: Diagnosis not present

## 2022-10-22 DIAGNOSIS — F418 Other specified anxiety disorders: Secondary | ICD-10-CM | POA: Diagnosis not present

## 2022-10-22 DIAGNOSIS — R7303 Prediabetes: Secondary | ICD-10-CM

## 2022-10-22 DIAGNOSIS — E782 Mixed hyperlipidemia: Secondary | ICD-10-CM

## 2022-10-22 MED ORDER — HYDROCHLOROTHIAZIDE 12.5 MG PO CAPS: 12.5000 mg | ORAL_CAPSULE | Freq: Every day | ORAL | 1 refills | Status: DC

## 2022-10-22 MED ORDER — FLUOXETINE HCL 40 MG PO CAPS: 40.0000 mg | ORAL_CAPSULE | Freq: Every day | ORAL | 3 refills | Status: DC

## 2022-10-22 MED ORDER — ROSUVASTATIN CALCIUM 5 MG PO TABS: 5.0000 mg | ORAL_TABLET | Freq: Every day | ORAL | 1 refills | Status: DC

## 2022-10-22 MED ORDER — BLOOD GLUCOSE TEST VI STRP: 1.0000 | ORAL_STRIP | Freq: Three times a day (TID) | 11 refills | Status: AC

## 2022-10-22 MED ORDER — LANCETS MISC. MISC: 1.0000 | Freq: Every day | 11 refills | Status: AC | PRN

## 2022-10-22 MED ORDER — PROPRANOLOL HCL ER 60 MG PO CP24: 60.0000 mg | ORAL_CAPSULE | Freq: Every day | ORAL | 1 refills | Status: DC

## 2022-10-22 MED ORDER — TIRZEPATIDE 2.5 MG/0.5ML ~~LOC~~ SOAJ: 2.5000 mg | SUBCUTANEOUS | 0 refills | Status: DC

## 2022-10-22 MED ORDER — TOPIRAMATE 50 MG PO TABS: ORAL_TABLET | ORAL | 1 refills | Status: DC

## 2022-10-22 MED ORDER — LISINOPRIL 30 MG PO TABS: 30.0000 mg | ORAL_TABLET | Freq: Every day | ORAL | 1 refills | Status: DC

## 2022-10-22 MED ORDER — ARIPIPRAZOLE 2 MG PO TABS: 2.0000 mg | ORAL_TABLET | Freq: Every day | ORAL | 3 refills | Status: DC

## 2022-10-22 MED ORDER — BLOOD GLUCOSE MONITORING SUPPL DEVI: 1.0000 | Freq: Three times a day (TID) | 0 refills | Status: AC

## 2022-10-22 MED ORDER — LANCET DEVICE MISC: 1.0000 | Freq: Three times a day (TID) | 0 refills | Status: AC

## 2022-10-22 MED ORDER — SUMATRIPTAN SUCCINATE 100 MG PO TABS: 100.0000 mg | ORAL_TABLET | Freq: Once | ORAL | 0 refills | Status: DC

## 2022-10-22 NOTE — Progress Notes (Signed)
Complete physical exam  Patient: Anita Heath   DOB: 1980-11-24   42 y.o. Female  MRN: [id number]  Subjective:    Chief Complaint  Patient presents with   Annual Exam    Anita Heath is a 42 y.o. female who presents today for a complete physical exam. She reports consuming a general diet.  Walking for exercise  She generally feels well. She reports sleeping well. She does have additional problems to discuss today.   Hypertension: Taking lisinopril 30 mg and hydrochlorothiazide 12.5 mg daily, tolerating well without side effects. Denies CP, SOB, palpitations, lower extremity edema, dizziness, headaches, or vision changes.  Hyperlipidemia: Taking Crestor 5 mg daily, tolerating well without side effects.  Working to follow a low-fat heart healthy diet.  Migraines: Taking propranolol ER 60 mg daily, Topamax 100 mg every morning, and Topamax 50 mg at bedtime.  She uses sumatriptan as needed for breakthrough migraines.  Feels the medication is working well for her.  Mood: Taking fluoxetine 40 mg daily and Abilify 2 mg daily.  Tolerating both medications well without side effects.  Feels that this is working to keep her mood very stable.  She does note that she has some days where she seems more irritable but thinks this is more in relation to being uncomfortable in her own body.  Prediabetes: Went to dermatology recently and they noted acanthosis nigricans.  She was encouraged to have her hemoglobin A1c checked.  When this was checked, it came back at 5.8% indicating prediabetic status.  Weight concerns: She continues to worry about her weight and aims for weight loss.  Has been able to lose approximately 15 pounds on her own with diet and exercise.  Is interested in medication to help with prediabetes as well as weight loss.  Questioning whether her insurance will cover Ozempic or Mounjaro.  Most recent fall risk assessment:    10/22/2022    2:44 PM  [place] in the past year? 0   Number falls in past yr: 0  Injury with Fall? 0  Risk for fall due to : No Fall Risks  Follow up Falls evaluation completed     Most recent depression screenings:    10/22/2022    2:44 PM 10/22/2022    1:52 PM  PHQ 2/9 Scores  PHQ - 2 Score 0 0  PHQ- 9 Score 3     Vision:Within last year and Dental: No current dental problems and Receives regular dental care    Patient Care Team: Samuel Bouche, NP as PCP - General (Nurse Practitioner)   Outpatient Medications Prior to Visit  Medication Sig   [DISCONTINUED] ARIPiprazole (ABILIFY) 2 MG tablet Take 1 tablet (2 mg total) by mouth daily.   [DISCONTINUED] FLUoxetine (PROZAC) 40 MG capsule Take 1 capsule (40 mg total) by mouth daily. NO REFILLS. NEEDS AN APPT W/PCP.   [DISCONTINUED] hydrochlorothiazide (MICROZIDE) 12.5 MG capsule Take 1 capsule (12.5 mg total) by mouth daily.   [DISCONTINUED] lisinopril (ZESTRIL) 30 MG tablet Take 1 tablet by mouth once daily   [DISCONTINUED] propranolol ER (INDERAL LA) 60 MG 24 hr capsule Take 1 capsule by mouth once daily   [DISCONTINUED] rosuvastatin (CRESTOR) 5 MG tablet Take 1 tablet by mouth once daily   [DISCONTINUED] topiramate (TOPAMAX) 50 MG tablet Take 2 tablets (100 mg) daily in the morning and 1 tablet (50 mg) daily at bedtime.   [DISCONTINUED] SUMAtriptan (IMITREX) 100 MG tablet Take 1 tablet (100 mg total) by mouth  once for 1 dose. May repeat in 2 hours if headache persists or recurs.   No facility-administered medications prior to visit.   Review of Systems  Constitutional:  Negative for chills, fever, malaise/fatigue and weight loss.  HENT:  Negative for congestion, ear pain, hearing loss, sinus pain and sore throat.   Eyes:  Negative for blurred vision, photophobia and pain.  Respiratory:  Negative for cough, shortness of breath and wheezing.   Cardiovascular:  Negative for chest pain, palpitations and leg swelling.  Gastrointestinal:  Negative for abdominal pain, constipation,  diarrhea, heartburn, nausea and vomiting.  Genitourinary:  Negative for dysuria, frequency and urgency.  Musculoskeletal:  Negative for falls and neck pain.  Skin:  Negative for itching and rash.  Neurological:  Negative for dizziness, weakness and headaches.  Endo/Heme/Allergies:  Negative for polydipsia. Does not bruise/bleed easily.  Psychiatric/Behavioral:  Negative for depression, substance abuse and suicidal ideas. The patient is not nervous/anxious.      Objective:     BP 109/66 (BP Location: Right Arm, Cuff Size: Large)   Pulse 70   Resp 20   Ht '5\' 4"'$  (1.626 m)   Wt 214 lb 9.6 oz (97.3 kg)   SpO2 98%   BMI 36.84 kg/m    Physical Exam Vitals reviewed.  Constitutional:      General: She is not in acute distress.    Appearance: Normal appearance.  HENT:     Head: Normocephalic and atraumatic.     Nose: Nose normal.     Mouth/Throat:     Mouth: Mucous membranes are moist.  Eyes:     General: No scleral icterus.       Right eye: No discharge.        Left eye: No discharge.     Extraocular Movements: Extraocular movements intact.     Conjunctiva/sclera: Conjunctivae normal.     Pupils: Pupils are equal, round, and reactive to light.  Cardiovascular:     Rate and Rhythm: Normal rate and regular rhythm.     Pulses: Normal pulses.     Heart sounds: Normal heart sounds. No murmur heard.    No friction rub. No gallop.  Pulmonary:     Effort: Pulmonary effort is normal. No respiratory distress.     Breath sounds: Normal breath sounds. No wheezing.  Abdominal:     General: Bowel sounds are normal.     Palpations: Abdomen is soft.  Musculoskeletal:        General: Normal range of motion.     Cervical back: Normal range of motion and neck supple.  Skin:    General: Skin is warm and dry.  Neurological:     Mental Status: She is alert and oriented to person, place, and time.  Psychiatric:        Mood and Affect: Mood normal.        Behavior: Behavior normal.         Thought Content: Thought content normal.        Judgment: Judgment normal.     Results for orders placed or performed in visit on 10/22/22  Hemoglobin A1c  Result Value Ref Range   Hemoglobin A1C 5.8        Assessment & Plan:    Routine Health Maintenance and Physical Exam  Immunization History  Administered Date(s) Administered   Influenza Split 06/13/2018   Influenza-Unspecified 08/03/2016, 06/13/2018, 05/05/2019, 06/17/2020, 05/20/2021, 05/23/2022   PFIZER(Purple Top)SARS-COV-2 Vaccination 04/01/2020, 04/29/2020   Tdap 03/04/2020  Health Maintenance  Topic Date Due   COVID-19 Vaccine (3 - 2023-24 season) 11/07/2022 (Originally 04/20/2022)   DTaP/Tdap/Td (2 - Td or Tdap) 03/04/2030   INFLUENZA VACCINE  Completed   Hepatitis C Screening  Completed   HIV Screening  Completed   HPV VACCINES  Aged Out    Discussed health benefits of physical activity, and encouraged her to engage in regular exercise appropriate for her age and condition.  1. Annual physical exam Labs completed within the last year.  Not due to repeat for now.  Wellness information provided with AVS.  2. Anxiety with depression Stable.  Continue fluoxetine 40 mg daily.  Continue Abilify 2 mg daily. - FLUoxetine (PROZAC) 40 MG capsule; Take 1 capsule (40 mg total) by mouth daily.  Dispense: 90 capsule; Refill: 3  3. Hypertension, unspecified type Stable.  Continue lisinopril and hydrochlorothiazide as ordered.  Continue monitoring blood pressure at home with a goal of 130/80 or less.  Low-sodium diet, regular intentional exercise, and continue weight loss efforts recommended. - hydrochlorothiazide (MICROZIDE) 12.5 MG capsule; Take 1 capsule (12.5 mg total) by mouth daily.  Dispense: 90 capsule; Refill: 1 - lisinopril (ZESTRIL) 30 MG tablet; Take 1 tablet (30 mg total) by mouth daily.  Dispense: 90 tablet; Refill: 1  4. Migraine without aura and without status migrainosus, not intractable Continue  propranolol ER and Topamax as prescribed.  Okay to use sumatriptan as needed for breakthrough migraines. - propranolol ER (INDERAL LA) 60 MG 24 hr capsule; Take 1 capsule (60 mg total) by mouth daily.  Dispense: 90 capsule; Refill: 1 - SUMAtriptan (IMITREX) 100 MG tablet; Take 1 tablet (100 mg total) by mouth once for 1 dose. May repeat in 2 hours if headache persists or recurs.  Dispense: 10 tablet; Refill: 0  5. Mixed hyperlipidemia Continue rosuvastatin. - rosuvastatin (CRESTOR) 5 MG tablet; Take 1 tablet (5 mg total) by mouth daily.  Dispense: 90 tablet; Refill: 1  6. Prediabetes Starting tirzepatide 2.5 mg weekly. - tirzepatide [place]) 2.5 MG/0.5ML Pen; Inject 2.5 mg into the skin once a week.  Dispense: 2 mL; Refill: 0  Return in about 4 weeks (around 11/19/2022) for weight check.     Samuel Bouche, NP

## 2022-10-23 ENCOUNTER — Encounter: Payer: Self-pay | Admitting: Medical-Surgical

## 2022-10-23 MED ORDER — OZEMPIC (0.25 OR 0.5 MG/DOSE) 2 MG/3ML ~~LOC~~ SOPN: 0.2500 mg | PEN_INJECTOR | SUBCUTANEOUS | 0 refills | Status: DC

## 2022-10-24 ENCOUNTER — Telehealth: Payer: Self-pay

## 2022-10-24 NOTE — Telephone Encounter (Addendum)
Initiated Prior authorization [id number] (0.25 or 0.5 MG/DOSE) 2MG /3ML pen-injectors Via: Covermymeds Case/Key:[id number] Status: approved as of 10/24/22 Reason:Coverage Start Date:10/24/2022;Coverage End Date:10/24/2023;. Authorization Expiration Date: October 24, 2023 Notified Pt via: Mychart

## 2022-11-19 ENCOUNTER — Ambulatory Visit: Payer: Managed Care, Other (non HMO) | Admitting: Medical-Surgical

## 2022-11-19 ENCOUNTER — Encounter: Payer: Self-pay | Admitting: Medical-Surgical

## 2022-11-19 VITALS — BP 108/67 | HR 78 | Resp 20 | Ht 64.0 in | Wt 209.5 lb

## 2022-11-19 DIAGNOSIS — R7303 Prediabetes: Secondary | ICD-10-CM

## 2022-11-19 DIAGNOSIS — Z7689 Persons encountering health services in other specified circumstances: Secondary | ICD-10-CM | POA: Diagnosis not present

## 2022-11-19 MED ORDER — OZEMPIC (0.25 OR 0.5 MG/DOSE) 2 MG/3ML ~~LOC~~ SOPN: 0.5000 mg | PEN_INJECTOR | SUBCUTANEOUS | 0 refills | Status: DC

## 2022-11-19 NOTE — Progress Notes (Signed)
        Established patient visit  History, exam, impression, and plan:  1. Encounter for weight management 2. Prediabetes Has completed 4 weeks of Ozempic 0.25 mg weekly, tolerating well without side effects.  Also exercising regularly with cardiovascular activities and weight training.  Has noted a significant reduction in appetite and feels fuller faster for a longer period of time.  According to her home scales, she has lost approximately 8 to 9 pounds.  Notes that her clothes are fitting differently.  Feels good about her progress so far and would like to continue with the medication.  Increasing Ozempic to 0.5 mg weekly x 4 weeks.  She will reach out to me via MyChart in 3 to 4 weeks to let me know how she is tolerating this dose and decide if she would like to increase to 1 mg weekly.  Plan to follow-up in office in approximately 3 months or sooner if needed.  Procedures performed this visit: None.  Return in about 3 months (around 02/18/2023) for weight check.  __________________________________ Clearnce Sorrel, DNP, APRN, FNP-BC [facility] and [place]

## 2022-12-13 ENCOUNTER — Encounter: Payer: Self-pay | Admitting: Medical-Surgical

## 2022-12-13 MED ORDER — SEMAGLUTIDE (1 MG/DOSE) 4 MG/3ML ~~LOC~~ SOPN: 1.0000 mg | PEN_INJECTOR | SUBCUTANEOUS | 0 refills | Status: DC

## 2022-12-28 ENCOUNTER — Ambulatory Visit: Payer: Managed Care, Other (non HMO) | Admitting: Family Medicine

## 2023-01-07 MED ORDER — SEMAGLUTIDE (2 MG/DOSE) 8 MG/3ML ~~LOC~~ SOPN: 2.0000 mg | PEN_INJECTOR | SUBCUTANEOUS | 1 refills | Status: DC

## 2023-01-07 NOTE — Addendum Note (Signed)
Addended byChristen Butter on: 01/07/2023 12:58 PM   Modules accepted: Orders

## 2023-02-17 NOTE — Progress Notes (Signed)
        Established patient visit  History, exam, impression, and plan:  1. Class 2 drug-induced obesity without serious comorbidity with body mass index (BMI) of 38.0 to 38.9 in adult Anita Heath 42 year old female presenting today for weight check on Ozempic 2 mg weekly.  Has been on the increased dose for approximately 3 weeks and is doing well overall.  Has noticed some GI discomfort with the increased dose but is using Gas-X as needed and has increased her water.  Having regular bowel movements so has not tried a stool softener.  Continues to have a reduced appetite and is working on healthy dietary options.  Walking every day for exercise but goes to the gym 1-2 times weekly.  She has noted improved activity tolerance as well as overall feeling better.  Continue Ozempic 2 mg weekly.  2. Primary hypertension Has been taking HCTZ 12.5 mg, lisinopril 30 mg, and propranolol ER 60 mg daily, tolerating well without significant side effects.  With the significant reduction in weight, her dosing has remained the same and she is now experiencing some hypotensive episodes that are accompanied by dizziness.  Denies CP, SOB, HA, LE edema, vision changes, and syncope.  HRR, S1/S2 normal.  Lungs CTA.  Respirations even unlabored.  No peripheral edema.  Continue HCTZ and propranolol ER as prescribed.  Cutting lisinopril back to 15 mg daily for the next 2 weeks.  Monitor blood pressure at home with a goal of 130/80 or less.  Requested she send me her readings via MyChart in approximately 2 weeks to evaluate further adjustment needs.   Procedures performed this visit: None.  Return in about 3 months (around 05/21/2023) for weight check.  __________________________________ Thayer Ohm, DNP, APRN, FNP-BC [facility] and [facility] Medicine [facility]

## 2023-02-18 ENCOUNTER — Encounter: Payer: Self-pay | Admitting: Medical-Surgical

## 2023-02-18 ENCOUNTER — Ambulatory Visit (INDEPENDENT_AMBULATORY_CARE_PROVIDER_SITE_OTHER): Payer: Managed Care, Other (non HMO) | Admitting: Medical-Surgical

## 2023-02-18 VITALS — BP 97/63 | HR 78 | Ht 64.0 in | Wt 194.1 lb

## 2023-02-18 DIAGNOSIS — Z6838 Body mass index (BMI) 38.0-38.9, adult: Secondary | ICD-10-CM | POA: Diagnosis not present

## 2023-02-18 DIAGNOSIS — E661 Drug-induced obesity: Secondary | ICD-10-CM | POA: Diagnosis not present

## 2023-02-18 DIAGNOSIS — I10 Essential (primary) hypertension: Secondary | ICD-10-CM

## 2023-03-04 ENCOUNTER — Encounter: Payer: Self-pay | Admitting: Medical-Surgical

## 2023-05-17 ENCOUNTER — Other Ambulatory Visit: Payer: Self-pay | Admitting: Medical-Surgical

## 2023-05-17 DIAGNOSIS — E782 Mixed hyperlipidemia: Secondary | ICD-10-CM

## 2023-05-17 DIAGNOSIS — G43009 Migraine without aura, not intractable, without status migrainosus: Secondary | ICD-10-CM

## 2023-05-17 DIAGNOSIS — I10 Essential (primary) hypertension: Secondary | ICD-10-CM

## 2023-05-21 ENCOUNTER — Encounter: Payer: Managed Care, Other (non HMO) | Admitting: Medical-Surgical

## 2023-06-23 ENCOUNTER — Encounter: Payer: Self-pay | Admitting: Medical-Surgical

## 2023-06-23 ENCOUNTER — Other Ambulatory Visit: Payer: Self-pay | Admitting: Medical-Surgical

## 2023-06-25 NOTE — Telephone Encounter (Signed)
Upcoming Appointment 06/26/2023

## 2023-06-26 ENCOUNTER — Encounter: Payer: Self-pay | Admitting: Medical-Surgical

## 2023-06-26 ENCOUNTER — Ambulatory Visit: Payer: Managed Care, Other (non HMO) | Admitting: Medical-Surgical

## 2023-06-26 DIAGNOSIS — I10 Essential (primary) hypertension: Secondary | ICD-10-CM

## 2023-06-26 DIAGNOSIS — E66812 Obesity, class 2: Secondary | ICD-10-CM | POA: Diagnosis not present

## 2023-06-26 DIAGNOSIS — E282 Polycystic ovarian syndrome: Secondary | ICD-10-CM

## 2023-06-26 DIAGNOSIS — E782 Mixed hyperlipidemia: Secondary | ICD-10-CM

## 2023-06-26 DIAGNOSIS — F419 Anxiety disorder, unspecified: Secondary | ICD-10-CM

## 2023-06-26 DIAGNOSIS — R7303 Prediabetes: Secondary | ICD-10-CM

## 2023-06-26 DIAGNOSIS — G43009 Migraine without aura, not intractable, without status migrainosus: Secondary | ICD-10-CM

## 2023-06-26 DIAGNOSIS — E661 Drug-induced obesity: Secondary | ICD-10-CM

## 2023-06-26 DIAGNOSIS — Z6838 Body mass index (BMI) 38.0-38.9, adult: Secondary | ICD-10-CM

## 2023-06-26 DIAGNOSIS — F324 Major depressive disorder, single episode, in partial remission: Secondary | ICD-10-CM

## 2023-06-26 MED ORDER — HYDROCHLOROTHIAZIDE 12.5 MG PO CAPS: 12.5000 mg | ORAL_CAPSULE | Freq: Every day | ORAL | 0 refills | Status: DC

## 2023-06-26 MED ORDER — SUMATRIPTAN SUCCINATE 100 MG PO TABS: 100.0000 mg | ORAL_TABLET | Freq: Once | ORAL | 3 refills | Status: AC

## 2023-06-26 MED ORDER — ARIPIPRAZOLE 2 MG PO TABS: 2.0000 mg | ORAL_TABLET | Freq: Every day | ORAL | 3 refills | Status: DC

## 2023-06-26 MED ORDER — LISINOPRIL 30 MG PO TABS: 15.0000 mg | ORAL_TABLET | Freq: Every day | ORAL | 3 refills | Status: DC

## 2023-06-26 MED ORDER — ROSUVASTATIN CALCIUM 5 MG PO TABS: 5.0000 mg | ORAL_TABLET | Freq: Every day | ORAL | 0 refills | Status: DC

## 2023-06-26 MED ORDER — PROPRANOLOL HCL ER 60 MG PO CP24: 60.0000 mg | ORAL_CAPSULE | Freq: Every day | ORAL | 0 refills | Status: DC

## 2023-06-26 MED ORDER — TOPIRAMATE 50 MG PO TABS: ORAL_TABLET | ORAL | 1 refills | Status: DC

## 2023-06-26 MED ORDER — OZEMPIC (0.25 OR 0.5 MG/DOSE) 2 MG/3ML ~~LOC~~ SOPN: PEN_INJECTOR | SUBCUTANEOUS | 1 refills | Status: DC

## 2023-06-26 MED ORDER — FLUOXETINE HCL 40 MG PO CAPS: 40.0000 mg | ORAL_CAPSULE | Freq: Every day | ORAL | 3 refills | Status: DC

## 2023-06-26 NOTE — Progress Notes (Signed)
Established patient visit  History, exam, impression, and plan:  1. 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 PCOS (polycystic ovarian syndrome) 3. Prediabetes Very pleasant 42 year old female presenting today for follow-up on the use of Ozempic.  Was previously prescribed Ozempic 2 mg weekly however has been off the medication due to a lapse of insurance after losing her job.  She is now employed and has insurance in place and would like to get restarted on the medication to help with weight loss.  Over the last several months, she has had some significant depression which led to changes in her daily habits.  Has been overeating and not exercising regularly.  Has gained some of her weight back which is very upsetting.  Tearful in office today while discussing this.  Is aware of recommendations for lifestyle changes rather than temporary activities.  Checking hemoglobin A1c today.  Restarting Ozempic at 0.25 mg weekly x 4 weeks with an increase to 0.5 mg weekly for 4 weeks.  At that point, we will check in to see how she is doing and if we need to change her dose.  We did review that her new insurance may not cover injectables for weight loss and we will need to discuss another plan if that is the case. - Hemoglobin A1c  4. Mixed hyperlipidemia She does have a history of mixed hyperlipidemia currently treated with Crestor 5 mg daily.  Checking lipids today.  Continue Crestor. - rosuvastatin (CRESTOR) 5 MG tablet; Take 1 tablet (5 mg total) by mouth daily.  Dispense: 90 tablet; Refill: 0  5. Primary hypertension Hypertension is well-controlled with hydrochlorothiazide 12.5 mg and lisinopril 15 mg daily.  Reports that during her period without insurance, she was not taking her medications every day as prescribed and was trying to make them last until she could get coverage again.  Has been monitoring her blood pressure since  she noticed she was gaining weight and reports that her home readings have been at goal.  Denies concerning symptoms.  Cardiopulmonary exam normal today.  Plan to check labs as below.  Blood pressure is well-controlled today so continue lisinopril and HCTZ as prescribed. - hydrochlorothiazide (MICROZIDE) 12.5 MG capsule; Take 1 capsule (12.5 mg total) by mouth daily.  Dispense: 90 capsule; Refill: 0 - lisinopril (ZESTRIL) 30 MG tablet; Take 0.5 tablets (15 mg total) by mouth daily.  Dispense: 45 tablet; Refill: 3 - CBC with Differential/Platelet - CMP14+EGFR - Lipid panel  6. Major depressive disorder with single episode, in partial remission (HCC) 7. Anxiety Unfortunately, she did have a period of significant depression over the last several months that she is working to resolve.  She attributes this to taking her medications erratically rather than every day as prescribed.  Taking fluoxetine 40 mg daily and Abilify 2 mg daily.  The medications worked well for her when she was taking them every day and would like to get refills to get restarted on these.  She is a bit tearful during her appointment with speaking about her weight loss journey but otherwise her mood is normal with normal affect, speech, and cognition.  Resuming daily dosing of fluoxetine 40 mg and Abilify 2 mg daily.  Denies SI/HI. - FLUoxetine (PROZAC) 40 MG capsule; Take 1 capsule (40 mg total) by mouth daily.  Dispense: 90 capsule; Refill: 3  8. Migraine without aura and without status migrainosus, not  intractable She is doing very well on Topamax 100 mg every morning and 50 mg every night along with propranolol ER 60 mg daily.  Has a prescription for Imitrex that she has not had to use in quite a while.  No concerns with breakthrough migraines.  No side effects.  Continue Topamax and propranolol as prescribed. - propranolol ER (INDERAL LA) 60 MG 24 hr capsule; Take 1 capsule (60 mg total) by mouth daily.  Dispense: 90 capsule;  Refill: 0 - SUMAtriptan (IMITREX) 100 MG tablet; Take 1 tablet (100 mg total) by mouth once for 1 dose. May repeat in 2 hours if headache persists or recurs.  Dispense: 10 tablet; Refill: 3   Procedures performed this visit: None.  Return in about 3 months (around 09/26/2023) for chronic disease follow up.  __________________________________ Thayer Ohm, DNP, APRN, FNP-BC Primary Care and Sports Medicine [facility]

## 2023-06-27 LAB — CMP14+EGFR
ALT: 37 [IU]/L — ABNORMAL HIGH (ref 0–32)
AST: 29 [IU]/L (ref 0–40)
Albumin: 4.4 g/dL (ref 3.9–4.9)
Alkaline Phosphatase: 84 [IU]/L (ref 44–121)
BUN/Creatinine Ratio: 16 (ref 9–23)
BUN: 13 mg/dL (ref 6–24)
Bilirubin Total: 0.5 mg/dL (ref 0.0–1.2)
CO2: 25 mmol/L (ref 20–29)
Calcium: 9.4 mg/dL (ref 8.7–10.2)
Chloride: 100 mmol/L (ref 96–106)
Creatinine, Ser: 0.8 mg/dL (ref 0.57–1.00)
Globulin, Total: 2.8 g/dL (ref 1.5–4.5)
Glucose: 85 mg/dL (ref 70–99)
Potassium: 3.9 mmol/L (ref 3.5–5.2)
Sodium: 138 mmol/L (ref 134–144)
Total Protein: 7.2 g/dL (ref 6.0–8.5)
eGFR: 93 mL/min/{1.73_m2} (ref >59)

## 2023-06-27 LAB — CBC WITH DIFFERENTIAL/PLATELET
Basophils Absolute: 0.1 10*3/uL (ref 0.0–0.2)
Basos: 1 %
EOS (ABSOLUTE): 0.1 10*3/uL (ref 0.0–0.4)
Eos: 1 %
Hematocrit: 43.3 % (ref 34.0–46.6)
Hemoglobin: 14.6 g/dL (ref 11.1–15.9)
Immature Grans (Abs): 0 10*3/uL (ref 0.0–0.1)
Immature Granulocytes: 0 %
Lymphocytes Absolute: 3 10*3/uL (ref 0.7–3.1)
Lymphs: 33 %
MCH: 29.4 pg (ref 26.6–33.0)
MCHC: 33.7 g/dL (ref 31.5–35.7)
MCV: 87 fL (ref 79–97)
Monocytes Absolute: 0.4 10*3/uL (ref 0.1–0.9)
Monocytes: 5 %
Neutrophils Absolute: 5.4 10*3/uL (ref 1.4–7.0)
Neutrophils: 60 %
Platelets: 339 10*3/uL (ref 150–450)
RBC: 4.96 x10E6/uL (ref 3.77–5.28)
RDW: 12.4 % (ref 11.7–15.4)
WBC: 9.1 10*3/uL (ref 3.4–10.8)

## 2023-06-27 LAB — HEMOGLOBIN A1C
Est. average glucose Bld gHb Est-mCnc: 114 mg/dL
Hgb A1c MFr Bld: 5.6 % (ref 4.8–5.6)

## 2023-06-27 LAB — LIPID PANEL
Chol/HDL Ratio: 5.1 ratio — ABNORMAL HIGH (ref 0.0–4.4)
Cholesterol, Total: 158 mg/dL (ref 100–199)
HDL: 31 mg/dL — ABNORMAL LOW (ref >39)
LDL Chol Calc (NIH): 95 mg/dL (ref 0–99)
Triglycerides: 185 mg/dL — ABNORMAL HIGH (ref 0–149)
VLDL Cholesterol Cal: 32 mg/dL (ref 5–40)

## 2023-06-28 ENCOUNTER — Telehealth: Payer: Self-pay

## 2023-06-28 ENCOUNTER — Ambulatory Visit: Payer: Managed Care, Other (non HMO) | Admitting: Medical-Surgical

## 2023-06-28 NOTE — Telephone Encounter (Addendum)
.  Initiated Prior authorization NFA:OZHYQMV (0.25 or 0.5 MG/DOSE) 2MG /3ML pen-injectors Via: Covermymeds Case/Key:[id number] Status: approved  as of 06/28/23 Reason:Authorization Expiration Date: June 27, 2024. Notified Pt via: Mychart

## 2023-08-03 ENCOUNTER — Encounter: Payer: Self-pay | Admitting: Medical-Surgical

## 2023-08-09 ENCOUNTER — Telehealth: Payer: Self-pay | Admitting: Medical-Surgical

## 2023-08-09 ENCOUNTER — Telehealth: Payer: Self-pay

## 2023-08-09 NOTE — Telephone Encounter (Unsigned)
Copied from CRM [id number]. Topic: Clinical - Medication Question >> Aug 09, 2023 11:21 AM Anita Heath wrote: Reason for CRM: Patient is calling to get help with her Preauthorize for the new [employer] insurance for her Ozempic medication.

## 2023-08-09 NOTE — Telephone Encounter (Signed)
Initiated Prior authorization WJX:BJYNWGN (0.25 or 0.5 MG/DOSE) 2MG /3ML pen-injectors Via: Covermymeds Case/Key:[id number] Status: denied as of 08/09/23 Reason:medical criteria not met  Notified Pt via: Mychart

## 2023-08-12 ENCOUNTER — Telehealth: Payer: Self-pay | Admitting: Medical-Surgical

## 2023-08-12 ENCOUNTER — Encounter: Payer: Self-pay | Admitting: Medical-Surgical

## 2023-08-12 ENCOUNTER — Ambulatory Visit (INDEPENDENT_AMBULATORY_CARE_PROVIDER_SITE_OTHER): Payer: Managed Care, Other (non HMO) | Admitting: Medical-Surgical

## 2023-08-12 VITALS — BP 135/83 | HR 74 | Resp 20 | Ht 64.0 in | Wt 210.9 lb

## 2023-08-12 DIAGNOSIS — R7303 Prediabetes: Secondary | ICD-10-CM

## 2023-08-12 DIAGNOSIS — E661 Drug-induced obesity: Secondary | ICD-10-CM

## 2023-08-12 DIAGNOSIS — E66812 Obesity, class 2: Secondary | ICD-10-CM | POA: Diagnosis not present

## 2023-08-12 DIAGNOSIS — E282 Polycystic ovarian syndrome: Secondary | ICD-10-CM | POA: Diagnosis not present

## 2023-08-12 DIAGNOSIS — E782 Mixed hyperlipidemia: Secondary | ICD-10-CM | POA: Diagnosis not present

## 2023-08-12 DIAGNOSIS — I10 Essential (primary) hypertension: Secondary | ICD-10-CM

## 2023-08-12 DIAGNOSIS — Z6838 Body mass index (BMI) 38.0-38.9, adult: Secondary | ICD-10-CM

## 2023-08-12 MED ORDER — OZEMPIC (0.25 OR 0.5 MG/DOSE) 2 MG/3ML ~~LOC~~ SOPN: PEN_INJECTOR | SUBCUTANEOUS | 1 refills | Status: DC

## 2023-08-12 NOTE — Telephone Encounter (Signed)
Initiated Prior authorization for: Ozempic 0.25mg  weekly Via: Covermymeds Case/Key: [id number] Status: Pending as of 08/12/23 6:08pm \\Notified  Pt via: Mychart

## 2023-08-12 NOTE — Progress Notes (Signed)
        Established patient visit  History, exam, impression, and plan:  1. Class 2 drug-induced obesity without serious comorbidity with body mass index (BMI) of 38.0 to 38.9 in adult (Primary) 2. PCOS (polycystic ovarian syndrome) 3. Prediabetes 4. Mixed hyperlipidemia 5. Primary hypertension Pleasant 42 year old female presenting today to follow-up on the prescription for Ozempic.  She previously had 2 insurances however she has had to let when go and her new insurance is requesting a prior authorization.  She has been using the medication, tolerating well without side effects.  Wants to continue but this will greatly depend on insurance coverage.  On review, it looks like a prior authorization was submitted on 08/09/2023 with subsequent denial.  Looking at the information submitted, this looks to be incomplete.  Resending the medication to the pharmacy and plan to submit a prior authorization request with all information as noted above.  Given her BMI of 36.2 and comorbidities of PCOS, prediabetes, hyperlipidemia, and hypertension, she should meet criteria for continued use of Ozempic.  She is currently exercising regularly as well as working on dietary changes for smaller portions and healthier options.  Feel she is appropriate to continue Ozempic at this time. - Semaglutide,0.25 or 0.5MG /DOS, (OZEMPIC, 0.25 OR 0.5 MG/DOSE,) 2 MG/3ML SOPN; Inject 0.25mg  weekly for 4 weeks then increase to 0.5mg  weekly for 4 weeks.  Dispense: 3 mL; Refill: 1  Procedures performed this visit: None.  Return if symptoms worsen or fail to improve.  __________________________________ Thayer Ohm, DNP, APRN, FNP-BC Primary Care and Sports Medicine [facility] [place]

## 2023-08-15 ENCOUNTER — Other Ambulatory Visit: Payer: Self-pay | Admitting: Medical-Surgical

## 2023-08-15 ENCOUNTER — Encounter: Payer: Self-pay | Admitting: Medical-Surgical

## 2023-08-15 MED ORDER — WEGOVY 0.25 MG/0.5ML ~~LOC~~ SOAJ: 0.2500 mg | SUBCUTANEOUS | 0 refills | Status: DC

## 2023-08-15 NOTE — Progress Notes (Signed)
PA for Ozempic denied due to no diagnosis of T2DM. Sending Wegovy to the pharmacy.   HKVQQV PA initiated 08/15/2023, Key [id number], status pending.  ___________________________________________ Thayer Ohm, DNP, APRN, FNP-BC Primary Care and Sports Medicine [facility] [place]

## 2023-08-15 NOTE — Telephone Encounter (Signed)
PA for [facility] approved, valid through 08/15/2023-02/13/2024. Patient notified via MyChart.   ___________________________________________ Thayer Ohm, DNP, APRN, FNP-BC Primary Care and Sports Medicine [facility] [place]

## 2023-09-01 ENCOUNTER — Other Ambulatory Visit: Payer: Self-pay | Admitting: Medical-Surgical

## 2023-09-02 MED ORDER — WEGOVY 0.5 MG/0.5ML ~~LOC~~ SOAJ: 0.5000 mg | SUBCUTANEOUS | 0 refills | Status: DC

## 2023-09-02 NOTE — Telephone Encounter (Signed)
 Requesting rx rf of wegovy Should strength be increased ? Last written 12/226/2024 Last OV 08/12/2023 Upcoming appt 09/26/2023

## 2023-09-26 ENCOUNTER — Encounter: Payer: Self-pay | Admitting: Medical-Surgical

## 2023-09-26 ENCOUNTER — Ambulatory Visit: Payer: PRIVATE HEALTH INSURANCE | Admitting: Medical-Surgical

## 2023-09-26 VITALS — BP 104/67 | HR 71 | Resp 20 | Ht 64.0 in | Wt 205.8 lb

## 2023-09-26 DIAGNOSIS — F419 Anxiety disorder, unspecified: Secondary | ICD-10-CM | POA: Diagnosis not present

## 2023-09-26 DIAGNOSIS — F324 Major depressive disorder, single episode, in partial remission: Secondary | ICD-10-CM

## 2023-09-26 DIAGNOSIS — R7303 Prediabetes: Secondary | ICD-10-CM

## 2023-09-26 DIAGNOSIS — E66812 Obesity, class 2: Secondary | ICD-10-CM | POA: Diagnosis not present

## 2023-09-26 DIAGNOSIS — Z6835 Body mass index (BMI) 35.0-35.9, adult: Secondary | ICD-10-CM

## 2023-09-26 DIAGNOSIS — R0683 Snoring: Secondary | ICD-10-CM

## 2023-09-26 DIAGNOSIS — I10 Essential (primary) hypertension: Secondary | ICD-10-CM

## 2023-09-26 MED ORDER — WEGOVY 1 MG/0.5ML ~~LOC~~ SOAJ: 1.0000 mg | SUBCUTANEOUS | 0 refills | Status: DC

## 2023-09-26 MED ORDER — SPIRONOLACTONE 25 MG PO TABS: 25.0000 mg | ORAL_TABLET | Freq: Every day | ORAL | 3 refills | Status: AC

## 2023-09-26 NOTE — Progress Notes (Signed)
 Established patient visit  History, exam, impression, and plan:  1. Class 2 severe obesity due to excess calories with serious comorbidity and body mass index (BMI) of 35.0 to 35.9 in adult [facility]) Pleasant 43 year old female presenting today for follow-up on weight management.  She has completed 4 weeks of Wegovy  0.25 mg and 2 weeks of a 0.5 mg dose.  Tolerating well without side effects.  Feels that she has not had much progress compared to when she was taking Ozempic  but does note 4 pounds of weight loss so far.  Is working on healthy dietary options and portion control.  Does cardiovascular exercises, usually on the elliptical, approximately 4-5 times weekly depending on her work schedule.  Would like to continue Wegovy  for now.  Advised to complete the final 2 weeks of 0.5 mg weekly and then plan to increase dose to Wegovy  1 mg weekly.  She would like to slowly step up her progress so we will plan to continue this for 3 months then reevaluate.  Continue dietary and lifestyle modifications.  2. Prediabetes (Primary) History of prediabetes with last hemoglobin A1c checked 3 months ago at 5.6%.  Not due for recheck today but we will plan to do this again at her next visit.  Continue a low carbohydrate diet with aim for weight loss as noted above.  3. Anxiety 4. Major depressive disorder with single episode, in partial remission (HCC) Currently taking fluoxetine  40 mg daily and Abilify  2 mg daily.  Reports that both medications are well-tolerated and seems to be doing well for her symptoms.  Denies side effects, SI, and HI.  Mood, affect, speech pattern, thought pattern, and cognition all normal during appointment.  Continue fluoxetine  and Abilify  as prescribed.  5. Primary hypertension She is currently taking lisinopril  30 mg daily, hydrochlorothiazide  12.5 mg daily, and propranolol  ER 60 mg daily.  Tolerating all medications well without side effects.  She has a blood pressure cuff at home  and checks occasionally, mostly when having concerning symptoms such as dizziness or lightheadedness.  Denies any concerns today.  She does have questions regarding medications to help with hirsutism associated with PCOS.  Discussed spironolactone  and its role in helping with hirsutism as well as blood pressure.  She would like to give this a try so we are discontinuing hydrochlorothiazide  and starting spironolactone  25 mg daily.  Advised that she may not see benefit in this for 3 to 6 months and we may have to play with the dosing depending on her blood pressure response.  Advised to monitor blood pressures at home regularly with a goal of less than 130/80.  If blood pressures start to drop, advised her to reach out to me so we can adjust other medications to avoid hypotension.  Would like her to come back in 2 weeks for nurse visit to check her blood pressure and while she is here, have labs drawn to reevaluate her electrolytes.  Patient verbalized understanding is agreeable to the plan. - Basic Metabolic Panel (BMET)  6. Loud snoring Continues to have issues with loud snoring.  Her previous insurance denied coverage of a sleep study however this has changed.  STOP-BANG criteria score of 5 indicating high risk.  Home sleep study ordered for further evaluation.  STOP-BANG for SLEEP APNEA Do you Snore loudly? Yes Do you often feel Tired during day? Yes Has anyone Observed you stop breathing? No History of high blood Pressure? Yes BMI >35? Yes Age >  50? No Neck circumference >16 in? Yes Gender female? No 5-8 = high risk 3-4 = intermediate 0-2 = low risk  - Home sleep test; Future  Procedures performed this visit: None.  Return in about 2 weeks (around 10/10/2023) for nurse visit for BP check; check BMET.  __________________________________ [name], DNP, APRN, FNP-BC Primary Care and Sports Medicine [facility] [place]

## 2023-09-29 ENCOUNTER — Other Ambulatory Visit: Payer: Self-pay | Admitting: Medical-Surgical

## 2023-09-29 DIAGNOSIS — G43009 Migraine without aura, not intractable, without status migrainosus: Secondary | ICD-10-CM

## 2023-09-30 ENCOUNTER — Other Ambulatory Visit: Payer: Self-pay | Admitting: Medical-Surgical

## 2023-09-30 DIAGNOSIS — G43009 Migraine without aura, not intractable, without status migrainosus: Secondary | ICD-10-CM

## 2023-10-09 ENCOUNTER — Ambulatory Visit: Payer: PRIVATE HEALTH INSURANCE | Admitting: Medical-Surgical

## 2023-10-10 ENCOUNTER — Ambulatory Visit: Payer: PRIVATE HEALTH INSURANCE

## 2023-10-11 ENCOUNTER — Ambulatory Visit (INDEPENDENT_AMBULATORY_CARE_PROVIDER_SITE_OTHER): Payer: PRIVATE HEALTH INSURANCE

## 2023-10-11 ENCOUNTER — Ambulatory Visit (HOSPITAL_BASED_OUTPATIENT_CLINIC_OR_DEPARTMENT_OTHER): Payer: PRIVATE HEALTH INSURANCE

## 2023-10-11 VITALS — BP 125/64 | HR 71 | Ht 64.0 in

## 2023-10-11 DIAGNOSIS — I10 Essential (primary) hypertension: Secondary | ICD-10-CM

## 2023-10-11 NOTE — Progress Notes (Signed)
   Established Patient Office Visit  Subjective   Patient ID: Anita Heath, female    DOB: Nov 30, 1980  Age: 43 y.o. MRN: [id number]  Chief Complaint  Patient presents with   Hypertension    BP check nurse visit.     HPI  Hypertension. BP check nurse visit. Patient denies chest pain, shortness of breath, dizziness, palpitations or medication problems.  ROS    Objective:     BP 130/70   Pulse 71   Ht 5\' 4"  (1.626 m)   SpO2 100%   BMI 35.33 kg/m    Physical Exam   No results found for any visits on 10/11/23.    The 10-year ASCVD risk score (Arnett DK, et al., 2019) is: 1.8%    Assessment & Plan:  BP check nurse visit. Initial reading = 130/70 second reading =125/64. Per Christen Butter, NP continue current medication regimen and return in 3  months for HTN follow up with provider.  Problem List Items Addressed This Visit   None   No follow-ups on file.    Elizabeth Palau, LPN

## 2023-10-11 NOTE — Patient Instructions (Signed)
 continue current medication regimen and return in 3  months for HTN follow up with provider.

## 2023-10-12 ENCOUNTER — Encounter: Payer: Self-pay | Admitting: Medical-Surgical

## 2023-10-12 LAB — BASIC METABOLIC PANEL
BUN/Creatinine Ratio: 11 (ref 9–23)
BUN: 10 mg/dL (ref 6–24)
CO2: 24 mmol/L (ref 20–29)
Calcium: 9.5 mg/dL (ref 8.7–10.2)
Chloride: 103 mmol/L (ref 96–106)
Creatinine, Ser: 0.89 mg/dL (ref 0.57–1.00)
Glucose: 85 mg/dL (ref 70–99)
Potassium: 4.1 mmol/L (ref 3.5–5.2)
Sodium: 139 mmol/L (ref 134–144)
eGFR: 82 mL/min/{1.73_m2} (ref >59)

## 2023-11-08 ENCOUNTER — Ambulatory Visit: Payer: PRIVATE HEALTH INSURANCE | Admitting: Medical-Surgical

## 2023-12-19 ENCOUNTER — Encounter: Payer: Self-pay | Admitting: Medical-Surgical

## 2023-12-19 ENCOUNTER — Other Ambulatory Visit (HOSPITAL_COMMUNITY): Payer: Self-pay

## 2023-12-19 ENCOUNTER — Telehealth: Payer: Self-pay

## 2023-12-19 NOTE — Telephone Encounter (Signed)
 Pharmacy Patient Advocate Encounter  Received notification from [employer] that Prior Authorization for Wegovy  1 has been DENIED.  Full denial letter will be uploaded to the media tab. See denial reason below.   PA #/Case ID/Reference #: [name]

## 2023-12-19 NOTE — Telephone Encounter (Signed)
 Pharmacy Patient Advocate Encounter   Received notification from CoverMyMeds that prior authorization for Wegovy  1 is required/requested.   Insurance verification completed.   The patient is insured through [facility] .   Per test claim: PA required; PA started via CoverMyMeds. KEY [id number] . Waiting for clinical questions to populate.  Blue plans only cover this medication for cardiovascular issues not weight loss. Will need chart notes to support claim. Used recent chart notes and lipid panel submitted to Cover My Meds.

## 2023-12-20 ENCOUNTER — Encounter: Payer: Self-pay | Admitting: Medical-Surgical

## 2023-12-20 ENCOUNTER — Ambulatory Visit (INDEPENDENT_AMBULATORY_CARE_PROVIDER_SITE_OTHER): Payer: PRIVATE HEALTH INSURANCE | Admitting: Medical-Surgical

## 2023-12-20 ENCOUNTER — Telehealth: Payer: Self-pay

## 2023-12-20 VITALS — BP 105/69 | HR 82 | Resp 20 | Ht 64.0 in | Wt 205.0 lb

## 2023-12-20 DIAGNOSIS — E66812 Obesity, class 2: Secondary | ICD-10-CM | POA: Diagnosis not present

## 2023-12-20 DIAGNOSIS — Z6835 Body mass index (BMI) 35.0-35.9, adult: Secondary | ICD-10-CM | POA: Diagnosis not present

## 2023-12-20 MED ORDER — PHENTERMINE HCL 37.5 MG PO TABS: ORAL_TABLET | ORAL | 0 refills | Status: DC

## 2023-12-20 MED ORDER — TOPIRAMATE 50 MG PO TABS: ORAL_TABLET | ORAL | 3 refills | Status: DC

## 2023-12-20 NOTE — Telephone Encounter (Signed)
 Copied from CRM [id number]. Topic: Clinical - Prescription Issue >> Dec 19, 2023  6:02 PM Tiffany H wrote: Reason for CRM: Dorothy with [facility] called to advise that PA for Wegovy  was denied. Will be sending letter with reference number. Please follow up with patient. No alternatives listed.

## 2023-12-20 NOTE — Progress Notes (Signed)
        Established patient visit  History, exam, impression, and plan:  1. Class 2 severe obesity due to excess calories with serious comorbidity and body mass index (BMI) of 35.0 to 35.9 in adult [facility]) (Primary) Very pleasant 43 year old female presenting today for follow-up on weight loss efforts.  She has been using Wegovy  1 mg weekly, tolerating well without side effects.  Unfortunately, she has been off of this for the last 2 weeks due to insurance concerns.  Her prior authorization was submitted and notification received today that it was denied.  She does have new insurance and they will be doing open enrollment which means that her insurance may again change in about 1 month.  Since we are unable to get Wegovy  at this time, plan to switch over to phentermine  for 1 to 3 months as this was well-tolerated in the past.  She is currently exercising doing walking 3-4 times weekly and intermittent weight training.  Has made multiple dietary changes and tends to eat very healthy.  If craving foods it usually for healthy options rather than junk food.  We do have concern for sleep apnea however she has not completed her sleep study.  We talked about getting the sleep study done and if sleep apnea is diagnosed, she may qualify for Zepbound .  Plans to complete the ordered sleep study as soon as possible.  For now, start phentermine  37.5 mg daily.  Continue working on exercise and dietary modifications.  Procedures performed this visit: None.  Return in about 4 weeks (around 01/17/2024) for weight check.  __________________________________ [name], DNP, APRN, FNP-BC Primary Care and Sports Medicine [facility] [place]

## 2024-01-01 ENCOUNTER — Encounter: Payer: Self-pay | Admitting: Medical-Surgical

## 2024-01-04 ENCOUNTER — Encounter: Payer: Self-pay | Admitting: Medical-Surgical

## 2024-01-09 ENCOUNTER — Telehealth (INDEPENDENT_AMBULATORY_CARE_PROVIDER_SITE_OTHER): Admitting: Medical-Surgical

## 2024-01-09 DIAGNOSIS — G4733 Obstructive sleep apnea (adult) (pediatric): Secondary | ICD-10-CM | POA: Insufficient documentation

## 2024-01-09 MED ORDER — AMBULATORY NON FORMULARY MEDICATION: 0 refills | Status: AC

## 2024-01-09 NOTE — Progress Notes (Signed)
 Virtual Visit via Video Note  I connected with Anita Heath on 01/11/24 at  1:20 PM EDT by a video enabled telemedicine application and verified that I am speaking with the correct person using two identifiers.   I discussed the limitations of evaluation and management by telemedicine and the availability of in person appointments. The patient expressed understanding and agreed to proceed.  Patient location: home Provider locations: office  Subjective:    CC: discuss sleep study results  HPI: Pleasant 43 year old female presenting via MyChart video visit to discuss recent sleep study and results.   Past medical history, Surgical history, Family history not pertinant except as noted below, Social history, Allergies, and medications have been entered into the medical record, reviewed, and corrections made.   Review of Systems: See HPI for pertinent positives and negatives.   Objective:    General: Speaking clearly in complete sentences without any shortness of breath.  Alert and oriented x3.  Normal judgment. No apparent acute distress.  Impression and Recommendations:    1. OSA (obstructive sleep apnea) (Primary) Discussed the sleep study report and findings of mild to moderate OSA with mild desaturations throughout the night. Reviewed conservative options for management including mouth guards, avoiding sedating meds/alcohol, weight loss, and elevating the head of the bed. Also reviewed CPAP therapy and associated follow up needs. She is familiar with a CPAP since her husband has one and is open to trying this. CPAP prescription completed and will be sent to the same DME company her husband uses. Plan to follow up 31-90 days after initiating CPAP use. Reviewed compliance guidelines.   I discussed the assessment and treatment plan with the patient. The patient was provided an opportunity to ask questions and all were answered. The patient agreed with the plan and demonstrated an  understanding of the instructions.   The patient was advised to call back or seek an in-person evaluation if the symptoms worsen or if the condition fails to improve as anticipated.  Return for CPAP follow up in 31-90 days.  [name], DNP, APRN, FNP-BC [facility] and Sports Medicine

## 2024-01-10 ENCOUNTER — Ambulatory Visit: Payer: PRIVATE HEALTH INSURANCE | Admitting: Medical-Surgical

## 2024-01-11 ENCOUNTER — Encounter: Payer: Self-pay | Admitting: Medical-Surgical

## 2024-01-16 ENCOUNTER — Other Ambulatory Visit: Payer: Self-pay | Admitting: Medical-Surgical

## 2024-01-16 DIAGNOSIS — F419 Anxiety disorder, unspecified: Secondary | ICD-10-CM

## 2024-01-16 DIAGNOSIS — F324 Major depressive disorder, single episode, in partial remission: Secondary | ICD-10-CM

## 2024-01-17 ENCOUNTER — Encounter: Payer: Self-pay | Admitting: Medical-Surgical

## 2024-01-17 ENCOUNTER — Ambulatory Visit: Admitting: Medical-Surgical

## 2024-01-17 VITALS — BP 107/72 | HR 99 | Resp 20 | Ht 64.0 in | Wt 204.0 lb

## 2024-01-17 DIAGNOSIS — E282 Polycystic ovarian syndrome: Secondary | ICD-10-CM

## 2024-01-17 DIAGNOSIS — I10 Essential (primary) hypertension: Secondary | ICD-10-CM

## 2024-01-17 DIAGNOSIS — Z6835 Body mass index (BMI) 35.0-35.9, adult: Secondary | ICD-10-CM

## 2024-01-17 DIAGNOSIS — R7303 Prediabetes: Secondary | ICD-10-CM | POA: Diagnosis not present

## 2024-01-17 DIAGNOSIS — G4733 Obstructive sleep apnea (adult) (pediatric): Secondary | ICD-10-CM | POA: Diagnosis not present

## 2024-01-17 DIAGNOSIS — E66812 Obesity, class 2: Secondary | ICD-10-CM | POA: Diagnosis not present

## 2024-01-17 NOTE — Progress Notes (Signed)
        Established patient visit  History, exam, impression, and plan:  1. Class 2 severe obesity due to excess calories with serious comorbidity and body mass index (BMI) of 35.0 to 35.9 in adult [facility]) (Primary) Very pleasant 43 year old female presenting today for follow-up on weight management.  She has completed 1 month of phentermine  37.5 mg daily.  Tolerating medication well without side effects.  She is not currently exercising but has been making dietary modifications to aim for low calorie, high-protein, and low fat.  Notes that she has not seen much benefit to using phentermine  this last month and did not lose more than 1 to 2 pounds.  She is disappointed that she did not have better results.  We discussed other options for weight management.  She does have a diagnosis of sleep apnea as well as polycystic ovarian syndrome.  Additionally, she has a diagnosis of hypertension.  She is interested in injectable medications if this will be covered by her insurance.  With a BMI of 35.02 this places her in class II obesity with multiple weight related comorbidities. Ultimately feel that she would greatly benefit from the addition of Zepbound  for sleep apnea and weight management. She will continue to follow a low calorie, high protein diet. She will also start an exercise program of strength training and cardiovascular exercises at least three times weekly. Start Zepbound  2.5mg  weekly.   Procedures performed this visit: None.  Return if symptoms worsen or fail to improve.  __________________________________ [name], DNP, APRN, FNP-BC Primary Care and Sports Medicine [facility] [place]

## 2024-01-17 NOTE — Progress Notes (Deleted)
   Established patient visit  History, exam, impression, and plan:  No problem-specific Assessment & Plan notes found for this encounter.   ROS  Physical Exam  Procedures performed this visit: None.  No follow-ups on file.  __________________________________ Thayer Ohm, DNP, APRN, FNP-BC Primary Care and Sports Medicine [facility] [place]

## 2024-01-19 MED ORDER — ZEPBOUND 2.5 MG/0.5ML ~~LOC~~ SOAJ: 2.5000 mg | SUBCUTANEOUS | 0 refills | Status: DC

## 2024-01-20 ENCOUNTER — Encounter: Payer: Self-pay | Admitting: Medical-Surgical

## 2024-01-31 ENCOUNTER — Telehealth: Payer: Self-pay

## 2024-01-31 ENCOUNTER — Other Ambulatory Visit (HOSPITAL_COMMUNITY): Payer: Self-pay

## 2024-01-31 NOTE — Telephone Encounter (Signed)
 Pharmacy Patient Advocate Encounter   Received notification from Patient Advice Request messages that prior authorization for Zepbound  2.5MG /0.5ML pen-injectors is required/requested.   Insurance verification completed.   The patient is insured through [employer] .   Per test claim: PA required and submitted KEY/EOC/Request #: [id number] due to: PLAN EXCLUSION

## 2024-02-06 ENCOUNTER — Ambulatory Visit: Payer: Self-pay

## 2024-02-06 NOTE — Telephone Encounter (Signed)
 Scheduled with Whitney Crain on 02/07/2024

## 2024-02-06 NOTE — Telephone Encounter (Signed)
 FYI Only or Action Required?: FYI only for provider.  Patient was last seen in primary care on 01/17/2024 by Cherre Cornish, NP. Called Nurse Triage reporting Blood Sugar Problem. Symptoms began several weeks ago. Interventions attempted: Rest, hydration, or home remedies and Ice/heat application. Symptoms are: fluctuating.  Triage Disposition: Call PCP Within 24 Hours  Patient/caregiver understands and will follow disposition?: Yes     Copied from CRM [id number]. Topic: Clinical - Pink Word Triage >> Feb 06, 2024 12:17 PM Karole Pacer C wrote: Reason for Triage: Patient states her sugar levels have been low and she has pre diabetes. She can be reached at [phone]    FYI- call disconnected before Care Advice could be given. Call patient back, no answer, LVM advising to keep 6/30 at 1:30pm appt, and fast PTA. Also to continue to monitor BG levels. If levels are too high and she is experiencing symptoms then to go to the nearest ED.   Reason for Disposition  [1] Blood glucose 70  mg/dL (3.9 mmol/L) or below OR symptomatic, now improved with Care Advice AND [2] cause unknown  Answer Assessment - Initial Assessment Questions 1. SYMPTOMS: What symptoms are you concerned about?     Feeling off not feeling well  2. ONSET:  When did the symptoms start?     Last couple of weeks  3. BLOOD GLUCOSE: What is your blood glucose level?      59 this morning, 115 after eating some candy  4. USUAL RANGE: What is your blood glucose level usually? (e.g., usual fasting morning value, usual evening value)     Never been before 112  5. TYPE 1 or 2:  Do you know what type of diabetes you have?  (e.g., Type 1, Type 2, Gestational; doesn't know)      Has not been diagnosed with Diabetes  6. INSULIN: Do you take insulin? What type of insulin(s) do you use? What is the mode of delivery? (syringe, pen; injection or pump) When did you last give yourself an insulin dose? (i.e., time or hours/minutes  ago) How much did you give? (i.e., how many units)     No  7. DIABETES PILLS: Do you take any pills for your diabetes? If Yes, ask: What is the name of the medicine(s) that you take for high blood sugar?     No  8. OTHER SYMPTOMS: Do you have any symptoms? (e.g., fever, frequent urination, difficulty breathing, vomiting)     Feels shaky, jittery, and headache (when low); feels tired (when high)  9. LOW BLOOD GLUCOSE TREATMENT: What have you done so far to treat the low blood glucose level?     Ate a couple pieces of candy  10. FOOD: When did you last eat or drink?       Just had lunch  11. ALONE: Are you alone right now or is someone with you?        Currently at work. Has coworkers  12. PREGNANCY: Is there any chance you are pregnant? When was your last menstrual period?       No, lmp had partial hysterectomy  Protocols used: Diabetes - Low Blood Sugar-A-AH

## 2024-02-07 ENCOUNTER — Ambulatory Visit: Payer: Self-pay | Admitting: Urgent Care

## 2024-02-07 ENCOUNTER — Encounter: Payer: Self-pay | Admitting: Urgent Care

## 2024-02-07 VITALS — BP 133/87 | HR 77 | Resp 20 | Ht 64.0 in | Wt 212.5 lb

## 2024-02-07 DIAGNOSIS — R631 Polydipsia: Secondary | ICD-10-CM | POA: Diagnosis not present

## 2024-02-07 DIAGNOSIS — R635 Abnormal weight gain: Secondary | ICD-10-CM | POA: Diagnosis not present

## 2024-02-07 DIAGNOSIS — R7309 Other abnormal glucose: Secondary | ICD-10-CM

## 2024-02-07 DIAGNOSIS — R3589 Other polyuria: Secondary | ICD-10-CM

## 2024-02-07 LAB — POCT URINALYSIS DIP (CLINITEK)
Bilirubin, UA: NEGATIVE
Blood, UA: NEGATIVE
Glucose, UA: NEGATIVE mg/dL
Ketones, POC UA: NEGATIVE mg/dL
Leukocytes, UA: NEGATIVE
Nitrite, UA: NEGATIVE
POC PROTEIN,UA: NEGATIVE
Spec Grav, UA: >=1.03 — AB (ref 1.010–1.025)
Urobilinogen, UA: 0.2 U/dL
pH, UA: 5.5 (ref 5.0–8.0)

## 2024-02-07 LAB — POCT GLYCOSYLATED HEMOGLOBIN (HGB A1C): Hemoglobin A1C: 5.2 % (ref 4.0–5.6)

## 2024-02-07 NOTE — Progress Notes (Signed)
 Established Patient Office Visit  Subjective:  Patient ID: Anita Heath, female    DOB: 1980-12-01  Age: 43 y.o. MRN: [id number]  Chief Complaint  Patient presents with   Medical Management of Chronic Issues    Low BS dropped to 59 yesterday this AM 115    HPI  Discussed the use of AI scribe software for clinical note transcription with the patient, who gave verbal consent to proceed.  History of Present Illness   Anita Heath is a 43 year old female who presents with fluctuating blood sugar levels and associated symptoms.  For the past week and a half, she has experienced significant fluctuations in her blood sugar levels, with readings ranging from 59 mg/dL to 799 mg/dL. During these episodes, she experiences dizziness, shakiness, and sweating, irrespective of whether her blood sugar is high or low. She is concerned about the accuracy of her glucometer as she bought one on [place] that did not require any calibration.   She has not been on any medication for blood sugar control, and there have been no changes in her diet or exercise routine. Despite this, she has gained seven pounds since her last visit less than a month ago. She has an unfilled prescription for tirzepatide  due to insurance issues.  She reports increased thirst, polydipsia, and xerostomia, consuming one and a half to two gallons of water daily but still feeling thirsty. She also notes difficulty concentrating and feeling 'off' even when not experiencing jittery symptoms.  Her last A1c in November was 5.6%, which was normal. She has not experienced these issues before this recent period.   She denies any recent medication changes, noting that all of her chronic medication doses and frequencies have remained unchanged. She has not missed any doses. She takes Topamax  for migraine prevention, and states she has been on this for several years with no issues to date.      Patient Active Problem List   Diagnosis Date Noted    OSA (obstructive sleep apnea) 01/09/2024   Migraine headache 03/27/2022   Prediabetes 11/11/2020   Class 2 obesity due to excess calories with body mass index (BMI) of 35.0 to 35.9 in adult 06/08/2020   Loud snoring 06/08/2020   Hypertension 06/08/2020   Thyroid  nodule 06/08/2020   Vitamin D  deficiency 06/08/2020   Hyperlipidemia 03/29/2017   Depression 09/07/2016   Urinary incontinence 04/09/2014   Obesity, Class I, BMI 30-34.9 10/22/2013   PCOS (polycystic ovarian syndrome) 10/22/2013   Anxiety 07/17/2013   Past Medical History:  Diagnosis Date   Anxiety and depression    H/O: hysterectomy 02/26/2014   Hyperlipidemia    Hypertension    Thyroid  disease    Past Surgical History:  Procedure Laterality Date   CHOLECYSTECTOMY     INCONTINENCE SURGERY     TOTAL ABDOMINAL HYSTERECTOMY     TUBAL LIGATION     Uterine Ablation     WISDOM TOOTH EXTRACTION     Social History   Tobacco Use   Smoking status: Never   Smokeless tobacco: Never  Vaping Use   Vaping status: Never Used  Substance Use Topics   Alcohol use: No   Drug use: Never      ROS: as noted in HPI  Objective:     BP 133/87 (BP Location: Left Arm, Patient Position: Sitting, Cuff Size: Normal)   Pulse 77   Resp 20   Ht 5' 4 (1.626 m)   Wt 212 lb 8 oz (96.4 kg)  SpO2 98%   BMI 36.48 kg/m  BP Readings from Last 3 Encounters:  02/07/24 133/87  01/17/24 107/72  12/20/23 105/69   Wt Readings from Last 3 Encounters:  02/07/24 212 lb 8 oz (96.4 kg)  01/17/24 204 lb (92.5 kg)  12/20/23 205 lb (93 kg)      Physical Exam Vitals and nursing note reviewed.  Constitutional:      General: She is not in acute distress.    Appearance: Normal appearance. She is not ill-appearing, toxic-appearing or diaphoretic.  HENT:     Head: Normocephalic and atraumatic.     Right Ear: Tympanic membrane, ear canal and external ear normal. There is no impacted cerumen.     Left Ear: Tympanic membrane, ear canal and  external ear normal. There is no impacted cerumen.     Nose: Nose normal.     Mouth/Throat:     Mouth: Mucous membranes are dry.     Pharynx: Oropharynx is clear. No oropharyngeal exudate or posterior oropharyngeal erythema.   Eyes:     General: No scleral icterus.       Right eye: No discharge.        Left eye: No discharge.     Extraocular Movements: Extraocular movements intact.     Pupils: Pupils are equal, round, and reactive to light.   Neck:     Thyroid : No thyroid  mass, thyromegaly or thyroid  tenderness.   Cardiovascular:     Rate and Rhythm: Normal rate and regular rhythm.     Pulses: Normal pulses.     Heart sounds: No murmur heard. Pulmonary:     Effort: Pulmonary effort is normal. No respiratory distress.     Breath sounds: Normal breath sounds. No stridor. No wheezing or rhonchi.  Abdominal:     General: Bowel sounds are normal.   Musculoskeletal:     Cervical back: Normal range of motion and neck supple. No rigidity or tenderness.     Right lower leg: No edema.     Left lower leg: No edema.  Lymphadenopathy:     Cervical: No cervical adenopathy.   Skin:    General: Skin is warm and dry.     Coloration: Skin is not jaundiced.     Findings: No bruising, erythema or rash.   Neurological:     General: No focal deficit present.     Mental Status: She is alert and oriented to person, place, and time.     Sensory: No sensory deficit.     Motor: No weakness.   Psychiatric:        Mood and Affect: Mood normal.        Behavior: Behavior normal.      Results for orders placed or performed in visit on 02/07/24  POCT glycosylated hemoglobin (Hb A1C)  Result Value Ref Range   Hemoglobin A1C 5.2 4.0 - 5.6 %   HbA1c POC (<> result, manual entry)     HbA1c, POC (prediabetic range)     HbA1c, POC (controlled diabetic range)    POCT URINALYSIS DIP (CLINITEK)  Result Value Ref Range   Clarity, UA clear clear   Glucose, UA negative negative mg/dL   Bilirubin, UA  negative negative   Ketones, POC UA negative negative mg/dL   Spec Grav, UA >=8.969 (A) 1.010 - 1.025   Blood, UA negative negative   pH, UA 5.5 5.0 - 8.0   POC PROTEIN,UA negative negative, trace   Urobilinogen, UA 0.2 0.2 or 1.0  E.U./dL   Nitrite, UA Negative Negative   Leukocytes, UA Negative Negative    Last CBC Lab Results  Component Value Date   WBC 9.1 06/26/2023   HGB 14.6 06/26/2023   HCT 43.3 06/26/2023   MCV 87 06/26/2023   MCH 29.4 06/26/2023   RDW 12.4 06/26/2023   PLT 339 06/26/2023   Last metabolic panel Lab Results  Component Value Date   GLUCOSE 85 10/11/2023   NA 139 10/11/2023   K 4.1 10/11/2023   CL 103 10/11/2023   CO2 24 10/11/2023   BUN 10 10/11/2023   CREATININE 0.89 10/11/2023   EGFR 82 10/11/2023   CALCIUM  9.5 10/11/2023   PROT 7.2 06/26/2023   ALBUMIN 4.4 06/26/2023   LABGLOB 2.8 06/26/2023   BILITOT 0.5 06/26/2023   ALKPHOS 84 06/26/2023   AST 29 06/26/2023   ALT 37 (H) 06/26/2023   Last lipids Lab Results  Component Value Date   CHOL 158 06/26/2023   HDL 31 (L) 06/26/2023   LDLCALC 95 06/26/2023   TRIG 185 (H) 06/26/2023   CHOLHDL 5.1 (H) 06/26/2023   Last hemoglobin A1c Lab Results  Component Value Date   HGBA1C 5.2 02/07/2024   Last thyroid  functions Lab Results  Component Value Date   TSH 1.67 01/31/2022   T4TOTAL 8.0 05/12/2020   Last vitamin D  Lab Results  Component Value Date   VD25OH 30 05/12/2020   Last vitamin B12 and Folate No results found for: VITAMINB12, FOLATE    The 10-year ASCVD risk score (Arnett DK, et al., 2019) is: 1.9%  Assessment & Plan:  Weight gain -     POCT glycosylated hemoglobin (Hb A1C) -     TSH -     CMP14+EGFR -     CBC with Differential/Platelet  Polyuria -     POCT glycosylated hemoglobin (Hb A1C) -     POCT URINALYSIS DIP (CLINITEK) -     TSH -     CMP14+EGFR -     CBC with Differential/Platelet  Polydipsia  Labile blood glucose -     TSH -     CMP14+EGFR -      CBC with Differential/Platelet  Assessment and Plan    Blood glucose fluctuations Significant fluctuations in blood glucose levels with symptoms of dizziness, shakiness, sweating, and difficulty concentrating. No changes in diet or exercise. Recent weight gain, increased thirst, and urination. Last A1c was 5.6, today 5.2%. No recent infection sx. - Ordered A1c test in office which was normal - Obtain urine sample in office - negative for ketones and glucose, but elevated specific gravity noted - Thorough medication review performed, question if Topamax  is causing blood glucose fluctuations. Pt taking two 50mg  tabs in AM and one 50mg  tab in PM... requested slow taper to see if this aids in symptoms. - Discussed OJ to help when glucose levels low, consider apple cider vinegar or cinnamon when high - Purchase new glucometer and continue to monitor primarily when symptomatic.  Dry mouth Experiencing dry mouth, possibly related to blood glucose issues. Consumes significant water but remains thirsty. Ddx would be diabetes insipidus. Will consider workup with possible arginine vasopressin/ copeptin testing and water deprivation testing if no improvement with cessation of topamax  - Monitor for changes in dry mouth symptoms after addressing blood glucose fluctuations.  General Health Maintenance Emphasized the importance of regular blood glucose monitoring and maintaining a consistent diet and exercise routine. - Encourage regular monitoring of blood glucose levels. - Advise maintaining  a consistent diet and exercise routine. - Keep follow up currently scheduled for 03/06/24, sooner if symptoms persist or worsen.        No follow-ups on file.   Benton LITTIE Gave, PA

## 2024-02-07 NOTE — Patient Instructions (Addendum)
 Your A1C is normal. Your urine sample looks good. Please get a new glucometer that has a proper calibration to make sure your readings are accurate.  Topamax  can cause glucose issues. Please start by stopping one of your morning doses. Take 50mg  in AM and 50mg  in PM x 1-2 weeks. If you notice significant improvement in your symptoms, after 2 weeks cut out the second AM dose.  Make sure you are eating on a consistent schedule. Goal glucose readings:  Fasting goal: <120 1 hour after eating: <180 2 hours after eating: <140  Keep your follow up in July, return sooner if symptoms persist.

## 2024-02-08 ENCOUNTER — Ambulatory Visit: Payer: Self-pay | Admitting: Urgent Care

## 2024-02-08 LAB — CMP14+EGFR
ALT: 32 IU/L (ref 0–32)
AST: 28 IU/L (ref 0–40)
Albumin: 4.1 g/dL (ref 3.9–4.9)
Alkaline Phosphatase: 83 IU/L (ref 44–121)
BUN/Creatinine Ratio: 15 (ref 9–23)
BUN: 12 mg/dL (ref 6–24)
Bilirubin Total: 0.5 mg/dL (ref 0.0–1.2)
CO2: 22 mmol/L (ref 20–29)
Calcium: 8.9 mg/dL (ref 8.7–10.2)
Chloride: 104 mmol/L (ref 96–106)
Creatinine, Ser: 0.82 mg/dL (ref 0.57–1.00)
Globulin, Total: 2.5 g/dL (ref 1.5–4.5)
Glucose: 89 mg/dL (ref 70–99)
Potassium: 4 mmol/L (ref 3.5–5.2)
Sodium: 140 mmol/L (ref 134–144)
Total Protein: 6.6 g/dL (ref 6.0–8.5)
eGFR: 90 mL/min/{1.73_m2} (ref >59)

## 2024-02-08 LAB — CBC WITH DIFFERENTIAL/PLATELET
Basophils Absolute: 0.1 10*3/uL (ref 0.0–0.2)
Basos: 1 %
EOS (ABSOLUTE): 0.1 10*3/uL (ref 0.0–0.4)
Eos: 1 %
Hematocrit: 40.5 % (ref 34.0–46.6)
Hemoglobin: 13 g/dL (ref 11.1–15.9)
Immature Grans (Abs): 0 10*3/uL (ref 0.0–0.1)
Immature Granulocytes: 0 %
Lymphocytes Absolute: 2.4 10*3/uL (ref 0.7–3.1)
Lymphs: 34 %
MCH: 29.1 pg (ref 26.6–33.0)
MCHC: 32.1 g/dL (ref 31.5–35.7)
MCV: 91 fL (ref 79–97)
Monocytes Absolute: 0.4 10*3/uL (ref 0.1–0.9)
Monocytes: 6 %
Neutrophils Absolute: 4.2 10*3/uL (ref 1.4–7.0)
Neutrophils: 57 %
Platelets: 307 10*3/uL (ref 150–450)
RBC: 4.46 x10E6/uL (ref 3.77–5.28)
RDW: 12.7 % (ref 11.7–15.4)
WBC: 7.1 10*3/uL (ref 3.4–10.8)

## 2024-02-08 LAB — TSH: TSH: 1.69 u[IU]/mL (ref 0.450–4.500)

## 2024-02-14 ENCOUNTER — Encounter: Payer: Self-pay | Admitting: Medical-Surgical

## 2024-02-14 ENCOUNTER — Other Ambulatory Visit: Payer: Self-pay | Admitting: Medical-Surgical

## 2024-02-14 ENCOUNTER — Telehealth (INDEPENDENT_AMBULATORY_CARE_PROVIDER_SITE_OTHER): Admitting: Medical-Surgical

## 2024-02-14 DIAGNOSIS — E66812 Obesity, class 2: Secondary | ICD-10-CM | POA: Diagnosis not present

## 2024-02-14 MED ORDER — TIRZEPATIDE-WEIGHT MANAGEMENT 2.5 MG/0.5ML ~~LOC~~ SOLN: 2.5200 mg | SUBCUTANEOUS | 0 refills | Status: DC

## 2024-02-14 MED ORDER — ZEPBOUND 2.5 MG/0.5ML ~~LOC~~ SOAJ: 2.5000 mg | SUBCUTANEOUS | 0 refills | Status: DC

## 2024-02-14 MED ORDER — ZEPBOUND 5 MG/0.5ML ~~LOC~~ SOAJ: 5.0000 mg | SUBCUTANEOUS | 0 refills | Status: DC

## 2024-02-14 MED ORDER — TIRZEPATIDE-WEIGHT MANAGEMENT 5 MG/0.5ML ~~LOC~~ SOLN: 5.0000 mg | SUBCUTANEOUS | 0 refills | Status: DC

## 2024-02-14 NOTE — Progress Notes (Signed)
 Virtual Visit via Video Note  I connected with Anita Heath on 02/14/24 at  8:10 AM EDT by a video enabled telemedicine application and verified that I am speaking with the correct person using two identifiers.   I discussed the limitations of evaluation and management by telemedicine and the availability of in person appointments. The patient expressed understanding and agreed to proceed.  Patient location: home Provider locations: office  Subjective:    CC: Weight loss  HPI: Pleasant 43 year old female presenting via MyChart video visit to discuss weight loss efforts.  She was prescribed Zepbound  however her insurance has come back denying coverage saying this was a plan exclusion.  She called her insurance company and HR regarding the coverage and was told that there may be an error in the formulary as the lower cost plan covers the medication but the higher cost plan does not.  They are looking into this and it may be resolved soon.  In the meantime, she is very concerned about working on weight loss and what options there are to assist her.  She is taking Topamax  however after years on the medication, it seems to be affecting her sugars.  She is in the process of weaning down on Topamax  and doing fairly well.  She is working on regular intentional exercise with a lower calorie diet.  Has reviewed the manufacturer program for Zepbound  and is interested in doing the self-pay option for a month or 2 while insurance is getting the formulary information clarified.   Past medical history, Surgical history, Family history not pertinant except as noted below, Social history, Allergies, and medications have been entered into the medical record, reviewed, and corrections made.   Review of Systems: See HPI for pertinent positives and negatives.   Objective:    General: Speaking clearly in complete sentences without any shortness of breath.  Alert and oriented x3.  Normal judgment. No apparent acute  distress.  Impression and Recommendations:    1. Class 2 severe obesity due to excess calories with serious comorbidity and body mass index (BMI) of 35.0 to 35.9 in adult [facility]) (Primary) Reviewed oral options for weight loss assistance as well as other injectables.  At this point, she would like to go ahead with the self-pay option through the manufacturer.  Sending in Zepbound  2.5 mg x 4 weeks followed by 5 mg weekly x 4 weeks to the [facility] direct self pay pharmacy.  Advised her to go on their website and follow any instructions to get signed up so there are no missed communications.  She will let me know if insurance gets this straightened out and we need to revert back to a local pharmacy.  Plan to follow-up in person in 3 months on the medication to evaluate response and tolerance.   I discussed the assessment and treatment plan with the patient. The patient was provided an opportunity to ask questions and all were answered. The patient agreed with the plan and demonstrated an understanding of the instructions.   The patient was advised to call back or seek an in-person evaluation if the symptoms worsen or if the condition fails to improve as anticipated.  Return in about 3 months (around 05/16/2024) for weight check.  [name], DNP, APRN, FNP-BC [facility] and Sports Medicine

## 2024-02-17 ENCOUNTER — Ambulatory Visit: Admitting: Medical-Surgical

## 2024-02-17 ENCOUNTER — Ambulatory Visit: Payer: Self-pay | Admitting: Medical-Surgical

## 2024-02-17 ENCOUNTER — Other Ambulatory Visit: Payer: Self-pay

## 2024-02-18 MED ORDER — TIRZEPATIDE-WEIGHT MANAGEMENT 2.5 MG/0.5ML ~~LOC~~ SOLN: 2.5000 mg | SUBCUTANEOUS | 0 refills | Status: DC

## 2024-02-25 ENCOUNTER — Other Ambulatory Visit: Payer: Self-pay | Admitting: Medical-Surgical

## 2024-02-25 MED ORDER — TIRZEPATIDE-WEIGHT MANAGEMENT 2.5 MG/0.5ML ~~LOC~~ SOLN: 2.5000 mg | SUBCUTANEOUS | 0 refills | Status: DC

## 2024-02-28 ENCOUNTER — Ambulatory Visit: Payer: Self-pay | Admitting: Medical-Surgical

## 2024-03-03 MED ORDER — PHENTERMINE HCL 37.5 MG PO TABS: ORAL_TABLET | ORAL | 0 refills | Status: DC

## 2024-03-03 NOTE — Addendum Note (Signed)
 Addended byBETHA WILLO MINI on: 03/03/2024 08:35 PM   Modules accepted: Orders

## 2024-03-06 ENCOUNTER — Ambulatory Visit: Admitting: Medical-Surgical

## 2024-03-06 ENCOUNTER — Encounter: Payer: Self-pay | Admitting: Medical-Surgical

## 2024-03-06 ENCOUNTER — Telehealth (INDEPENDENT_AMBULATORY_CARE_PROVIDER_SITE_OTHER): Admitting: Medical-Surgical

## 2024-03-06 DIAGNOSIS — E669 Obesity, unspecified: Secondary | ICD-10-CM

## 2024-03-06 DIAGNOSIS — Z79899 Other long term (current) drug therapy: Secondary | ICD-10-CM

## 2024-03-06 DIAGNOSIS — G4733 Obstructive sleep apnea (adult) (pediatric): Secondary | ICD-10-CM

## 2024-03-06 DIAGNOSIS — E66812 Morbid (severe) obesity due to excess calories: Secondary | ICD-10-CM

## 2024-03-06 NOTE — Progress Notes (Signed)
 Virtual Visit via Video Note  I connected with Anita Heath on 03/06/24 at  8:50 AM EDT by a video enabled telemedicine application and verified that I am speaking with the correct person using two identifiers.   I discussed the limitations of evaluation and management by telemedicine and the availability of in person appointments. The patient expressed understanding and agreed to proceed.  Patient location: home Provider locations: office  Subjective:    CC: CPAP follow up  HPI: Discussed the use of AI scribe software for clinical note transcription with the patient, who gave verbal consent to proceed.  History of Present Illness   Anita Heath is a 43 year old female who presents for follow-up on CPAP therapy.  Obstructive sleep apnea symptoms and cpap adherence - Consistently uses CPAP machine for more than four hours each night - Bedtime around 9 PM, wakes between 5 and 5:30 AM - Improved sleep quality with increased restfulness - No morning headaches or daytime fatigue - Tolerates full face mask well, with occasional dislodgement during sleep - All necessary CPAP supplies are on auto-ship with [employer] - Does not use specific cleaning supplies for the mask  Weight management and dietary habits - Current weight is 208 pounds, decreased from 212.5 pounds on June 20th - Follows a 1300 calorie diet through [employer], focusing on specific fruits and proteins  Pharmacologic therapy for weight loss and metabolic health - Started metformin 500 mg daily without gastrointestinal side effects - Phentermine  prescription obtained but not yet started; plans to begin on Monday after obtaining groceries for her new diet     Past medical history, Surgical history, Family history not pertinant except as noted below, Social history, Allergies, and medications have been entered into the medical record, reviewed, and corrections made.   Review of Systems: See HPI for pertinent positives and  negatives.   Objective:    General: Speaking clearly in complete sentences without any shortness of breath.  Alert and oriented x3.  Normal judgment. No apparent acute distress.  Impression and Recommendations:    Assessment and Plan    Obstructive Sleep Apnea Good compliance with CPAP, reports improved rest and no morning headaches or daytime fatigue. - Recommend downloading [employer] app to track CPAP compliance and effectiveness. - Requesting compliance report. - Continue using CPAP machine nightly. - Ensure CPAP supplies are on auto-ship, consider CPAP-specific cleaning wipes if needed.  Obesity Weight loss noted on 1300 calorie diet. Starting phentermine  to aid further weight loss. Discussed hormonal and stress impacts on weight and benefits of improved sleep on cortisol levels. - Start phentermine  as planned. - Continue 1300 calorie diet through [employer]. - Schedule follow-up appointment four weeks after starting phentermine .  Metformin Use Started on metformin 500 mg daily, increasing to 1000 mg. Safe with current medications, advised monitoring for gastrointestinal side effects. - Monitor for gastrointestinal side effects and adjust dosage as needed. - Update medication list to include metformin.     I discussed the assessment and treatment plan with the patient. The patient was provided an opportunity to ask questions and all were answered. The patient agreed with the plan and demonstrated an understanding of the instructions.   The patient was advised to call back or seek an in-person evaluation if the symptoms worsen or if the condition fails to improve as anticipated.  Return in about 4 weeks (around 04/03/2024) for weight check.  Anita FREDRIK Palin, DNP, APRN, FNP-BC [facility] and Sports Medicine

## 2024-04-03 ENCOUNTER — Ambulatory Visit: Admitting: Medical-Surgical

## 2024-05-29 ENCOUNTER — Other Ambulatory Visit: Payer: Self-pay | Admitting: Medical-Surgical

## 2024-05-29 DIAGNOSIS — G43009 Migraine without aura, not intractable, without status migrainosus: Secondary | ICD-10-CM

## 2024-06-11 NOTE — Progress Notes (Deleted)
        Established patient visit   History of Present Illness   Discussed the use of AI scribe software for clinical note transcription with the patient, who gave verbal consent to proceed.  History of Present Illness           Physical Exam   Physical Exam Assessment & Plan   Problem List Items Addressed This Visit       Other   Class 2 obesity due to excess calories with body mass index (BMI) of 35.0 to 35.9 in adult - Primary   Assessment and Plan             Follow up   No follow-ups on file. __________________________________ Anita FREDRIK Palin, DNP, APRN, FNP-BC Primary Care and Sports Medicine [facility] [place]

## 2024-06-12 ENCOUNTER — Ambulatory Visit: Admitting: Medical-Surgical

## 2024-06-15 ENCOUNTER — Other Ambulatory Visit: Payer: Self-pay | Admitting: Medical-Surgical

## 2024-06-15 DIAGNOSIS — E782 Mixed hyperlipidemia: Secondary | ICD-10-CM

## 2024-07-10 ENCOUNTER — Ambulatory Visit: Admitting: Medical-Surgical

## 2024-07-11 ENCOUNTER — Other Ambulatory Visit: Payer: Self-pay | Admitting: Medical-Surgical

## 2024-07-11 DIAGNOSIS — F419 Anxiety disorder, unspecified: Secondary | ICD-10-CM

## 2024-07-11 DIAGNOSIS — F324 Major depressive disorder, single episode, in partial remission: Secondary | ICD-10-CM

## 2024-07-20 ENCOUNTER — Ambulatory Visit: Admitting: Medical-Surgical

## 2024-07-20 ENCOUNTER — Encounter: Payer: Self-pay | Admitting: Medical-Surgical

## 2024-07-20 VITALS — BP 107/72 | HR 72 | Resp 20 | Ht 64.0 in | Wt 213.0 lb

## 2024-07-20 DIAGNOSIS — E785 Hyperlipidemia, unspecified: Secondary | ICD-10-CM

## 2024-07-20 DIAGNOSIS — Z6836 Body mass index (BMI) 36.0-36.9, adult: Secondary | ICD-10-CM

## 2024-07-20 DIAGNOSIS — R7303 Prediabetes: Secondary | ICD-10-CM | POA: Diagnosis not present

## 2024-07-20 DIAGNOSIS — I10 Essential (primary) hypertension: Secondary | ICD-10-CM | POA: Diagnosis not present

## 2024-07-20 DIAGNOSIS — E66812 Obesity, class 2: Secondary | ICD-10-CM

## 2024-07-20 DIAGNOSIS — G43009 Migraine without aura, not intractable, without status migrainosus: Secondary | ICD-10-CM

## 2024-07-20 MED ORDER — PROPRANOLOL HCL ER 60 MG PO CP24: 60.0000 mg | ORAL_CAPSULE | Freq: Every day | ORAL | 11 refills | Status: AC

## 2024-07-20 MED ORDER — ZEPBOUND 2.5 MG/0.5ML ~~LOC~~ SOAJ: 2.5000 mg | SUBCUTANEOUS | 0 refills | Status: AC

## 2024-07-20 NOTE — Progress Notes (Signed)
        Established patient visit   History of Present Illness   Discussed the use of AI scribe software for clinical note transcription with the patient, who gave verbal consent to proceed.  History of Present Illness   Anita Heath is a 43 year old female with sleep apnea and hypertension who presents for medication management and weight loss consultation.  Body weight and weight management - Weight fluctuates between 204 and 206 pounds, with a prior maximum of 230 pounds - Goal weight is 150 to 160 pounds - Physical activity includes walking five to six days per week for 30 to 45 minutes and using arm weights - Considering addition of leg weights and a chest weight vest to exercise routine - Diet consists of one diet drink per day and water, with avoidance of sweet foods - Height is 5'3 - Trial of Zepbound  2.5mg  in July for four weeks, well tolerated - Insurance coverage for weight loss medications has been problematic  Sleep apnea - Moderate sleep apnea confirmed by recent sleep test  Hypertension - Managed with lisinopril  15mg , spironolactone  25mg  and propranolol  ER 60mg  daily, well tolerated  Hyperlipidemia - Managed with rosuvastatin  5mg  daily, well tolerated  Prediabetes - Managed with metformin 500mg  twice daily    Physical Exam   Physical Exam Vitals reviewed.  Constitutional:      General: She is not in acute distress.    Appearance: Normal appearance. She is obese. She is not ill-appearing.  HENT:     Head: Normocephalic and atraumatic.  Cardiovascular:     Rate and Rhythm: Normal rate and regular rhythm.  Pulmonary:     Effort: Pulmonary effort is normal. No respiratory distress.  Skin:    General: Skin is warm and dry.  Neurological:     Mental Status: She is alert and oriented to person, place, and time.  Psychiatric:        Mood and Affect: Mood normal.        Behavior: Behavior normal.        Thought Content: Thought content normal.         Judgment: Judgment normal.    Assessment & Plan     Class 2 severe obesity due to excess calories with serious comorbidity and body mass index (BMI) of 36.0 to 36.9 in adult  Class 2 obesity with moderate obstructive sleep apnea, prediabetes, hyperlipidemia, and primary hypertension. Did well on Zepbound  for the initial dose and interested in restarting now that her insurance has changed. - Submitted prior authorization for Zepbound  for weight loss, including BMI and comorbidities. - Prescribed Zepbound  2.5 mg for four weeks with increase in dose as tolerated. - Encouraged continuation of exercise and dietary habits. - Scheduled follow-up in three months to assess weight and medication efficacy.  Obstructive sleep apnea Moderate obstructive sleep apnea. Zepbound  beneficial for symptom management. - Included sleep apnea in prior authorization for Zepbound . - Continue CPAP use.   Primary hypertension Well-controlled with current medications. - Continue propranolol , lisinopril , and spironolactone  as prescribed.   Hyperlipidemia Well controlled on rosuvastatin  without side effects or intolerances. - Continue rosuvastatin  5mg  daily.   Prediabetes Doing well on metformin without intolerances or side effects. - Continue metformin 500mg  BID as prescribed.     Follow up   Return in about 3 months (around 10/18/2024) for weight check. __________________________________ [name], DNP, APRN, FNP-BC Primary Care and Sports Medicine [facility]

## 2024-07-22 ENCOUNTER — Telehealth: Payer: Self-pay

## 2024-07-22 ENCOUNTER — Encounter: Payer: Self-pay | Admitting: Medical-Surgical

## 2024-07-22 ENCOUNTER — Other Ambulatory Visit (HOSPITAL_COMMUNITY): Payer: Self-pay

## 2024-07-22 NOTE — Telephone Encounter (Signed)
 Has the prior authorization for the zepbound  been completed or in process? Thank you

## 2024-07-22 NOTE — Telephone Encounter (Signed)
 Pharmacy Patient Advocate Encounter   Received notification from Patient Advice Request messages that prior authorization for Zepbound  2.5mg /0.33ml is required/requested.   Insurance verification completed.   The patient is insured through [facility].   Per test claim: PA required; PA submitted to above mentioned insurance via Latent Key/confirmation #/EOC [id number] Status is pending

## 2024-07-23 ENCOUNTER — Other Ambulatory Visit (HOSPITAL_COMMUNITY): Payer: Self-pay

## 2024-07-23 NOTE — Telephone Encounter (Addendum)
 Pharmacy Patient Advocate Encounter  Received notification from [facility] that Prior Authorization for Zepbound  2.5mg /0.92ml has been APPROVED from 07/23/24 to 02/11/25. Spoke to pharmacy to process.Copay is $1,161 if filled at [facility].    PA #/Case ID/Reference #: [id number]   Per the approval letter, the medication must be filled through a [facility] or [facility].

## 2024-07-24 ENCOUNTER — Ambulatory Visit: Admitting: Medical-Surgical

## 2024-07-24 ENCOUNTER — Other Ambulatory Visit (HOSPITAL_COMMUNITY): Payer: Self-pay

## 2024-07-27 ENCOUNTER — Ambulatory Visit: Admitting: Medical-Surgical

## 2024-09-14 ENCOUNTER — Encounter: Payer: Self-pay | Admitting: Medical-Surgical

## 2024-09-14 DIAGNOSIS — F324 Major depressive disorder, single episode, in partial remission: Secondary | ICD-10-CM

## 2024-09-14 DIAGNOSIS — I10 Essential (primary) hypertension: Secondary | ICD-10-CM

## 2024-09-14 DIAGNOSIS — F419 Anxiety disorder, unspecified: Secondary | ICD-10-CM

## 2024-09-15 MED ORDER — LISINOPRIL 30 MG PO TABS: 15.0000 mg | ORAL_TABLET | Freq: Every day | ORAL | 1 refills | Status: AC

## 2024-09-15 MED ORDER — ARIPIPRAZOLE 2 MG PO TABS: 2.0000 mg | ORAL_TABLET | Freq: Every day | ORAL | 1 refills | Status: AC

## 2024-09-15 MED ORDER — FLUOXETINE HCL 40 MG PO CAPS: 40.0000 mg | ORAL_CAPSULE | Freq: Every day | ORAL | 1 refills | Status: AC

## 2024-09-15 MED ORDER — TOPIRAMATE 50 MG PO TABS: ORAL_TABLET | ORAL | 1 refills | Status: AC

## 2024-10-21 ENCOUNTER — Ambulatory Visit: Admitting: Medical-Surgical
